# Patient Record
Sex: Female | Born: 1955 | Race: Black or African American | Hispanic: No | Marital: Married
Health system: Southern US, Community
[De-identification: ages and names within clinical notes are randomized; demographics above are authoritative.]

## PROBLEM LIST (undated history)

## (undated) DIAGNOSIS — J181 Lobar pneumonia, unspecified organism: Secondary | ICD-10-CM

## (undated) DIAGNOSIS — J80 Acute respiratory distress syndrome: Secondary | ICD-10-CM

## (undated) DIAGNOSIS — I5021 Acute systolic (congestive) heart failure: Secondary | ICD-10-CM

## (undated) DIAGNOSIS — R6521 Severe sepsis with septic shock: Secondary | ICD-10-CM

## (undated) DIAGNOSIS — J9621 Acute and chronic respiratory failure with hypoxia: Secondary | ICD-10-CM

## (undated) DIAGNOSIS — N170 Acute kidney failure with tubular necrosis: Secondary | ICD-10-CM

## (undated) DIAGNOSIS — A419 Sepsis, unspecified organism: Secondary | ICD-10-CM

## (undated) HISTORY — PX: TRACHEOSTOMY: SUR1362

## (undated) HISTORY — PX: PEG PLACEMENT: SHX5437

---

## 2018-01-24 ENCOUNTER — Other Ambulatory Visit (HOSPITAL_COMMUNITY): Payer: Self-pay

## 2018-01-24 ENCOUNTER — Inpatient Hospital Stay
Admission: RE | Admit: 2018-01-24 | Discharge: 2018-03-19 | Disposition: A | Discharge status: Skilled Nursing Facility | Payer: Medicare Other | Attending: Internal Medicine | Admitting: Internal Medicine

## 2018-01-24 DIAGNOSIS — R079 Chest pain, unspecified: Secondary | ICD-10-CM

## 2018-01-24 DIAGNOSIS — R6521 Severe sepsis with septic shock: Secondary | ICD-10-CM

## 2018-01-24 DIAGNOSIS — K8020 Calculus of gallbladder without cholecystitis without obstruction: Secondary | ICD-10-CM

## 2018-01-24 DIAGNOSIS — J9621 Acute and chronic respiratory failure with hypoxia: Secondary | ICD-10-CM

## 2018-01-24 DIAGNOSIS — R109 Unspecified abdominal pain: Secondary | ICD-10-CM

## 2018-01-24 DIAGNOSIS — J181 Lobar pneumonia, unspecified organism: Secondary | ICD-10-CM

## 2018-01-24 DIAGNOSIS — Z931 Gastrostomy status: Secondary | ICD-10-CM

## 2018-01-24 DIAGNOSIS — I5021 Acute systolic (congestive) heart failure: Secondary | ICD-10-CM

## 2018-01-24 DIAGNOSIS — R17 Unspecified jaundice: Secondary | ICD-10-CM

## 2018-01-24 DIAGNOSIS — R0902 Hypoxemia: Secondary | ICD-10-CM

## 2018-01-24 DIAGNOSIS — A419 Sepsis, unspecified organism: Secondary | ICD-10-CM

## 2018-01-24 DIAGNOSIS — J80 Acute respiratory distress syndrome: Secondary | ICD-10-CM

## 2018-01-24 DIAGNOSIS — J9690 Respiratory failure, unspecified, unspecified whether with hypoxia or hypercapnia: Secondary | ICD-10-CM

## 2018-01-24 DIAGNOSIS — J811 Chronic pulmonary edema: Secondary | ICD-10-CM

## 2018-01-24 DIAGNOSIS — I509 Heart failure, unspecified: Secondary | ICD-10-CM

## 2018-01-24 DIAGNOSIS — D72829 Elevated white blood cell count, unspecified: Secondary | ICD-10-CM

## 2018-01-24 DIAGNOSIS — N189 Chronic kidney disease, unspecified: Secondary | ICD-10-CM

## 2018-01-24 DIAGNOSIS — N170 Acute kidney failure with tubular necrosis: Secondary | ICD-10-CM

## 2018-01-24 HISTORY — DX: Acute respiratory distress syndrome: J80

## 2018-01-24 HISTORY — DX: Lobar pneumonia, unspecified organism: J18.1

## 2018-01-24 HISTORY — DX: Acute and chronic respiratory failure with hypoxia: J96.21

## 2018-01-24 HISTORY — DX: Acute systolic (congestive) heart failure: I50.21

## 2018-01-24 HISTORY — DX: Sepsis, unspecified organism: A41.9

## 2018-01-24 HISTORY — DX: Acute kidney failure with tubular necrosis: N17.0

## 2018-01-24 HISTORY — DX: Severe sepsis with septic shock: R65.21

## 2018-01-24 LAB — BLOOD GAS, ARTERIAL
ACID-BASE DEFICIT: 2.6 mmol/L — AB (ref 0.0–2.0)
BICARBONATE: 21.7 mmol/L (ref 20.0–28.0)
FIO2: 40
O2 Saturation: 94.8 %
PEEP/CPAP: 5 cmH2O
PRESSURE CONTROL: 18 cmH2O
Patient temperature: 98.6
RATE: 14 resp/min
pCO2 arterial: 37.9 mmHg (ref 32.0–48.0)
pH, Arterial: 7.376 (ref 7.350–7.450)
pO2, Arterial: 75.1 mmHg — ABNORMAL LOW (ref 83.0–108.0)

## 2018-01-24 MED ORDER — IOPAMIDOL (ISOVUE-300) INJECTION 61%
INTRAVENOUS | Status: AC
  Administered 2018-01-24: 50 mL via GASTROSTOMY
  Filled 2018-01-24: qty 50

## 2018-01-25 ENCOUNTER — Encounter: Payer: Self-pay | Admitting: Internal Medicine

## 2018-01-25 ENCOUNTER — Other Ambulatory Visit (HOSPITAL_COMMUNITY): Payer: Self-pay

## 2018-01-25 DIAGNOSIS — N170 Acute kidney failure with tubular necrosis: Secondary | ICD-10-CM | POA: Diagnosis not present

## 2018-01-25 DIAGNOSIS — J80 Acute respiratory distress syndrome: Secondary | ICD-10-CM

## 2018-01-25 DIAGNOSIS — I5021 Acute systolic (congestive) heart failure: Secondary | ICD-10-CM

## 2018-01-25 DIAGNOSIS — J9621 Acute and chronic respiratory failure with hypoxia: Secondary | ICD-10-CM | POA: Diagnosis not present

## 2018-01-25 DIAGNOSIS — R6521 Severe sepsis with septic shock: Secondary | ICD-10-CM | POA: Diagnosis not present

## 2018-01-25 DIAGNOSIS — J181 Lobar pneumonia, unspecified organism: Secondary | ICD-10-CM

## 2018-01-25 DIAGNOSIS — A419 Sepsis, unspecified organism: Secondary | ICD-10-CM | POA: Diagnosis not present

## 2018-01-25 DIAGNOSIS — R042 Hemoptysis: Secondary | ICD-10-CM | POA: Diagnosis not present

## 2018-01-25 HISTORY — DX: Lobar pneumonia, unspecified organism: J18.1

## 2018-01-25 HISTORY — DX: Severe sepsis with septic shock: A41.9

## 2018-01-25 HISTORY — DX: Acute respiratory distress syndrome: J80

## 2018-01-25 HISTORY — DX: Acute kidney failure with tubular necrosis: N17.0

## 2018-01-25 HISTORY — DX: Acute systolic (congestive) heart failure: I50.21

## 2018-01-25 HISTORY — DX: Acute and chronic respiratory failure with hypoxia: J96.21

## 2018-01-25 LAB — COMPREHENSIVE METABOLIC PANEL
ALT: 123 U/L — ABNORMAL HIGH (ref 14–54)
ANION GAP: 19 — AB (ref 5–15)
AST: 108 U/L — AB (ref 15–41)
Albumin: 2.6 g/dL — ABNORMAL LOW (ref 3.5–5.0)
Alkaline Phosphatase: 491 U/L — ABNORMAL HIGH (ref 38–126)
BUN: 121 mg/dL — ABNORMAL HIGH (ref 6–20)
CHLORIDE: 95 mmol/L — AB (ref 101–111)
CO2: 19 mmol/L — ABNORMAL LOW (ref 22–32)
Calcium: 9.1 mg/dL (ref 8.9–10.3)
Creatinine, Ser: 5.29 mg/dL — ABNORMAL HIGH (ref 0.44–1.00)
GFR, EST AFRICAN AMERICAN: 9 mL/min — AB (ref >60)
GFR, EST NON AFRICAN AMERICAN: 8 mL/min — AB (ref >60)
Glucose, Bld: 135 mg/dL — ABNORMAL HIGH (ref 65–99)
POTASSIUM: 5.1 mmol/L (ref 3.5–5.1)
Sodium: 133 mmol/L — ABNORMAL LOW (ref 135–145)
Total Bilirubin: 4.3 mg/dL — ABNORMAL HIGH (ref 0.3–1.2)
Total Protein: 7.3 g/dL (ref 6.5–8.1)

## 2018-01-25 LAB — APTT: APTT: 25 s (ref 24–36)

## 2018-01-25 LAB — CBC
HCT: 26.1 % — ABNORMAL LOW (ref 36.0–46.0)
Hemoglobin: 8.1 g/dL — ABNORMAL LOW (ref 12.0–15.0)
MCH: 30.6 pg (ref 26.0–34.0)
MCHC: 31 g/dL (ref 30.0–36.0)
MCV: 98.5 fL (ref 78.0–100.0)
PLATELETS: 309 10*3/uL (ref 150–400)
RBC: 2.65 MIL/uL — ABNORMAL LOW (ref 3.87–5.11)
RDW: 21.7 % — AB (ref 11.5–15.5)
WBC: 26.5 10*3/uL — AB (ref 4.0–10.5)

## 2018-01-25 LAB — PROTIME-INR
INR: 1.15
PROTHROMBIN TIME: 14.6 s (ref 11.4–15.2)

## 2018-01-25 LAB — MAGNESIUM: MAGNESIUM: 2.5 mg/dL — AB (ref 1.7–2.4)

## 2018-01-25 MED ORDER — GENERIC EXTERNAL MEDICATION
5.00 | Status: DC
Start: ? — End: 2018-01-25

## 2018-01-25 MED ORDER — FAMOTIDINE 20 MG/2ML IV SOLN
20.00 | INTRAVENOUS | Status: DC
Start: 2018-01-25 — End: 2018-01-25

## 2018-01-25 MED ORDER — INSULIN GLARGINE 100 UNIT/ML ~~LOC~~ SOLN
1.00 | SUBCUTANEOUS | Status: DC
Start: 2018-01-24 — End: 2018-01-25

## 2018-01-25 MED ORDER — NITROGLYCERIN 0.4 MG SL SUBL
0.40 | SUBLINGUAL_TABLET | SUBLINGUAL | Status: DC
Start: ? — End: 2018-01-25

## 2018-01-25 MED ORDER — INSULIN GLARGINE 100 UNIT/ML ~~LOC~~ SOLN
1.00 | SUBCUTANEOUS | Status: DC
Start: ? — End: 2018-01-25

## 2018-01-25 MED ORDER — THIAMINE HCL 100 MG PO TABS
500.00 | ORAL_TABLET | ORAL | Status: DC
Start: 2018-01-24 — End: 2018-01-25

## 2018-01-25 MED ORDER — ALBUMIN HUMAN 25 % IV SOLN
12.50 | INTRAVENOUS | Status: DC
Start: ? — End: 2018-01-25

## 2018-01-25 MED ORDER — ONDANSETRON HCL 4 MG/2ML IJ SOLN
4.00 | INTRAMUSCULAR | Status: DC
Start: ? — End: 2018-01-25

## 2018-01-25 MED ORDER — POLYETHYLENE GLYCOL 3350 17 G PO PACK
17.00 | PACK | ORAL | Status: DC
Start: ? — End: 2018-01-25

## 2018-01-25 MED ORDER — ALBUTEROL SULFATE (2.5 MG/3ML) 0.083% IN NEBU
2.50 | INHALATION_SOLUTION | RESPIRATORY_TRACT | Status: DC
Start: ? — End: 2018-01-25

## 2018-01-25 MED ORDER — HYDROCORTISONE NA SUCCINATE PF 100 MG IJ SOLR
50.00 | INTRAMUSCULAR | Status: DC
Start: 2018-01-25 — End: 2018-01-25

## 2018-01-25 MED ORDER — SENNOSIDES 8.8 MG/5ML PO SYRP
15.00 | ORAL_SOLUTION | ORAL | Status: DC
Start: ? — End: 2018-01-25

## 2018-01-25 MED ORDER — SENNA 8.6 MG PO TABS
1.00 | ORAL_TABLET | ORAL | Status: DC
Start: ? — End: 2018-01-25

## 2018-01-25 MED ORDER — SODIUM CHLORIDE 0.9 % IJ SOLN
50.00 | INTRAMUSCULAR | Status: DC
Start: ? — End: 2018-01-25

## 2018-01-25 MED ORDER — SODIUM CHLORIDE 0.9 % IV SOLN
150.00 | INTRAVENOUS | Status: DC
Start: ? — End: 2018-01-25

## 2018-01-25 MED ORDER — INSULIN LISPRO 100 UNIT/ML ~~LOC~~ SOLN
1.00 | SUBCUTANEOUS | Status: DC
Start: ? — End: 2018-01-25

## 2018-01-25 MED ORDER — BISACODYL 10 MG RE SUPP
10.00 | RECTAL | Status: DC
Start: ? — End: 2018-01-25

## 2018-01-25 MED ORDER — SODIUM CHLORIDE 0.9 % IV SOLN
10.00 | INTRAVENOUS | Status: DC
Start: ? — End: 2018-01-25

## 2018-01-25 MED ORDER — CHLORHEXIDINE GLUCONATE 0.12 % MT SOLN
15.00 | OROMUCOSAL | Status: DC
Start: 2018-01-24 — End: 2018-01-25

## 2018-01-25 MED ORDER — FENTANYL CITRATE (PF) 100 MCG/2ML IJ SOLN
25.00 | INTRAMUSCULAR | Status: DC
Start: ? — End: 2018-01-25

## 2018-01-25 MED ORDER — HEPARIN SODIUM (PORCINE) 5000 UNIT/ML IJ SOLN
5000.00 | INTRAMUSCULAR | Status: DC
Start: 2018-01-25 — End: 2018-01-25

## 2018-01-25 MED ORDER — HEPARIN SODIUM (PORCINE) 1000 UNIT/ML IJ SOLN
500.00 | INTRAMUSCULAR | Status: DC
Start: ? — End: 2018-01-25

## 2018-01-25 MED ORDER — HYDRALAZINE HCL 20 MG/ML IJ SOLN
10.00 | INTRAMUSCULAR | Status: DC
Start: ? — End: 2018-01-25

## 2018-01-25 MED ORDER — ALTEPLASE 2 MG IJ SOLR
2.00 | INTRAMUSCULAR | Status: DC
Start: ? — End: 2018-01-25

## 2018-01-25 MED ORDER — CLOTRIMAZOLE 1 % EX CREA
TOPICAL_CREAM | CUTANEOUS | Status: DC
Start: 2018-01-24 — End: 2018-01-25

## 2018-01-25 MED ORDER — GENERIC EXTERNAL MEDICATION
1.00 | Status: DC
Start: 2018-01-25 — End: 2018-01-25

## 2018-01-25 MED ORDER — DIPHENHYDRAMINE HCL 50 MG/ML IJ SOLN
12.50 | INTRAMUSCULAR | Status: DC
Start: ? — End: 2018-01-25

## 2018-01-25 MED ORDER — INSULIN LISPRO 100 UNIT/ML ~~LOC~~ SOLN
1.00 | SUBCUTANEOUS | Status: DC
Start: 2018-01-24 — End: 2018-01-25

## 2018-01-25 NOTE — Consult Note (Signed)
Pulmonary Critical Care Medicine [facility] PULMONARY SERVICE  Date of Service: 01/25/2018  PULMONARY CONSULT   Terri Ellis  ZOX:[id number]  DOB: 1956/04/15   DOA: 01/24/2018  Referring Physician: Carron Curie, MD  HPI: Terri Ellis is a 63 y.o. female seen for follow up of Acute on Chronic Respiratory Failure.  Patient was admitted for further management of respiratory failure currently is on pressure support mode.  Presented to the hospital with a diagnosis of congestive heart failure elevated troponin stage III kidney disease and bacterial pneumonia.  From the review of the chart patient had been having some complaints of urinary symptoms.  The patient had increasing shortness of breath when she was going to the bathroom.  Apparently took some Benadryl however it did not help.  When she presented to the ED she had a leukocytosis CT scan showed bilateral pulmonary infiltrates she was started on BiPAP to help maintain her chest.  Pulmonology was asked to see the patient and felt that she had small effusions along with pneumonia.  Also she was noted to have elevated troponins felt to be stress-induced.  Patient also developed acute kidney injury from ATN and nephrology did see the patient for this purpose.  Cardiology evaluated the patient felt that she had stress-induced elevation of troponins.  She did also undergo a PEG placement.  During her hospital course she developed severe ARDS along with shock  Review of Systems:  ROS performed and is unremarkable other than noted above.  Past Medical History:  Diagnosis Date  . Acute on chronic respiratory failure with hypoxia (HCC) 01/25/2018  . Acute systolic CHF (congestive heart failure) (HCC) 01/25/2018  . Acute tubular necrosis (HCC) 01/25/2018  . ARDS (adult respiratory distress syndrome) (HCC) 01/25/2018  . Lobar pneumonia (HCC) 01/25/2018  . Severe sepsis with septic shock (HCC) 01/25/2018    Past  Surgical History:  Procedure Laterality Date  . PEG PLACEMENT    . TRACHEOSTOMY      Social History:    has no tobacco, alcohol, and drug history on file.  Family History: Non-Contributory to the present illness  Allergies  Allergen Reactions  . Diclofenac Other (See Comments)    Intolerance, elevated LFT Elevated LFT   . Pravastatin Other (See Comments)    Chest pain Chest pain   . Amitriptyline Other (See Comments) and Rash    Unknown   . Atorvastatin Other (See Comments)    Pain Hurt all over   . Desipramine Other (See Comments)    "felt funny" Felt funny   . Indomethacin Other (See Comments) and Nausea And Vomiting    GI upset   . Oxycodone Other (See Comments)    Caused hallucinations & falling  . Penicillins Hives and Rash    Pt took Keflex in 1996 with no problem   . Phentermine-Topiramate Other (See Comments)    Yeast infection It gave me a really bad yeast infection   . Prednisone Other (See Comments)    Dysphoria dysphoria   . Clarithromycin Other (See Comments)    Hives  . Diphenhydramine Hives    Medications: Reviewed on Rounds  Physical Exam:  Vitals: Temperature 97.6 pulse 92 respiratory rate 26 blood pressure 138/83 saturations 95%  Ventilator Settings mode of ventilation pressure support FiO2 40% tidal volume 416 pressure support 10 PEEP 5  . General: Comfortable at this time . Eyes: Grossly normal lids, irises & conjunctiva . ENT: grossly tongue is normal . Neck: no obvious mass .  Cardiovascular: S1-S2 normal no gallop or rub . Respiratory: No rhonchi expansion is equal . Abdomen: Soft nontender . Skin: no rash seen on limited exam . Musculoskeletal: not rigid . Psychiatric:unable to assess . Neurologic: no seizure no involuntary movements         Labs on Admission:  Basic Metabolic Panel: Recent Labs  Lab 01/25/18 0848  NA 133*  K 5.1  CL 95*  CO2 19*  GLUCOSE 135*  BUN 121*  CREATININE 5.29*  CALCIUM 9.1  MG  2.5*    Liver Function Tests: Recent Labs  Lab 01/25/18 0848  AST 108*  ALT 123*  ALKPHOS 491*  BILITOT 4.3*  PROT 7.3  ALBUMIN 2.6*   No results for input(s): LIPASE, AMYLASE in the last 168 hours. No results for input(s): AMMONIA in the last 168 hours.  CBC: Recent Labs  Lab 01/25/18 0848  WBC 26.5*  HGB 8.1*  HCT 26.1*  MCV 98.5  PLT 309    Cardiac Enzymes: No results for input(s): CKTOTAL, CKMB, CKMBINDEX, TROPONINI in the last 168 hours.  BNP (last 3 results) No results for input(s): BNP in the last 8760 hours.  ProBNP (last 3 results) No results for input(s): PROBNP in the last 8760 hours.   Radiological Exams on Admission: Dg Chest Port 1 View  Result Date: 01/25/2018 CLINICAL DATA:  Shortness of breath EXAM: PORTABLE CHEST 1 VIEW COMPARISON:  None. FINDINGS: There is cardiomegaly present with mild pulmonary edema. Small effusions cannot be excluded. Large bore right central venous line tip overlies the lower SVC and tracheostomy is present. IMPRESSION: 1. Cardiomegaly with mild edema. Cannot exclude small pleural effusions. 2. Tracheostomy and right central venous line are present. Electronically Signed   By: Dwyane Dee M.D.   On: 01/25/2018 09:44   Dg Abd Portable 1v  Result Date: 01/24/2018 CLINICAL DATA:  Status post PEG tube placement EXAM: PORTABLE ABDOMEN - 1 VIEW COMPARISON:  None. FINDINGS: KUB obtained after injection of approximately 50 cc of Isovue-300. Contrast is pooling in the fundus. No gross extravasation. IMPRESSION: Contrast within the gastric fundus.  No gross extravasation. Electronically Signed   By: Jasmine Pang M.D.   On: 01/24/2018 23:00    Assessment/Plan Principal Problem:   Acute on chronic respiratory failure with hypoxia (HCC) Active Problems:   Acute tubular necrosis (HCC)   Lobar pneumonia (HCC)   Severe sepsis with septic shock (HCC)   ARDS (adult respiratory distress syndrome) (HCC)   Acute systolic CHF (congestive  heart failure) (HCC)   1. Acute on chronic respiratory failure with hypoxia patient is on pressure support wean at this time currently is on 40% oxygen.  Her goal will be to advance the wean as tolerated we will continue with secretion management pulmonary toilet 2. Acute tubular necrosis patient's labs will be monitored had to have continuous renal replacement therapy at the transferring facility previously.  We will get neurology to see if necessary. 3. Lobar pneumonia treated with antibiotics we will continue supportive care 4. Severe sepsis with shock hemodynamically stable we will continue to follow 5. ARDS follow-up x-rays as necessary her current oxygen requirements have improved 6. Acute systolic heart failure patient needs to be kept on the dry side we will continue to monitor closely  I have personally seen and evaluated the patient, evaluated laboratory and imaging results, formulated the assessment and plan and placed orders. The Patient requires high complexity decision making for assessment and support.  Case was discussed on Rounds with  the Respiratory Therapy Staff Time Spent  Yevonne Pax, MD [facility] Pulmonary Critical Care Medicine Sleep Medicine

## 2018-01-26 DIAGNOSIS — I5021 Acute systolic (congestive) heart failure: Secondary | ICD-10-CM | POA: Diagnosis not present

## 2018-01-26 DIAGNOSIS — N170 Acute kidney failure with tubular necrosis: Secondary | ICD-10-CM | POA: Diagnosis not present

## 2018-01-26 DIAGNOSIS — J181 Lobar pneumonia, unspecified organism: Secondary | ICD-10-CM

## 2018-01-26 DIAGNOSIS — J80 Acute respiratory distress syndrome: Secondary | ICD-10-CM

## 2018-01-26 DIAGNOSIS — J9621 Acute and chronic respiratory failure with hypoxia: Secondary | ICD-10-CM

## 2018-01-26 DIAGNOSIS — A419 Sepsis, unspecified organism: Secondary | ICD-10-CM

## 2018-01-26 DIAGNOSIS — R6521 Severe sepsis with septic shock: Secondary | ICD-10-CM

## 2018-01-26 NOTE — Progress Notes (Signed)
Pulmonary Critical Care Medicine [facility] [place]   PULMONARY SERVICE  PROGRESS NOTE  Date of Service: 01/26/2018  Terri Ellis  MWN:[id number]  DOB: Sep 16, 1956   DOA: 01/24/2018  Referring Physician: Carron Curie, MD  HPI: Terri Ellis is a 62 y.o. female seen for follow up of Acute on Chronic Respiratory Failure.  Patient is weaning doing fairly well.  Family was present in the room and they were updated.  Right now is afebrile  Medications: Reviewed on Rounds  Physical Exam:  Vitals: Temperature 97.6 pulse 96 respiratory rate 35 blood pressure 139/73 saturation 97%  Ventilator Settings mode of ventilation pressure support 28% FiO2.  Pressure support level is 12/5 tidal lines are about 538  . General: Comfortable at this time . Eyes: Grossly normal lids, irises & conjunctiva . ENT: grossly tongue is normal . Neck: no obvious mass . Cardiovascular: S1-S2 normal no gallop or rub . Respiratory: Scattered rhonchi expansion is equal . Abdomen: Soft nontender . Skin: no rash seen on limited exam . Musculoskeletal: not rigid . Psychiatric:unable to assess . Neurologic: no seizure no involuntary movements         Labs on Admission:  Basic Metabolic Panel: Recent Labs  Lab 01/25/18 0848  NA 133*  K 5.1  CL 95*  CO2 19*  GLUCOSE 135*  BUN 121*  CREATININE 5.29*  CALCIUM 9.1  MG 2.5*    Liver Function Tests: Recent Labs  Lab 01/25/18 0848  AST 108*  ALT 123*  ALKPHOS 491*  BILITOT 4.3*  PROT 7.3  ALBUMIN 2.6*   No results for input(s): LIPASE, AMYLASE in the last 168 hours. No results for input(s): AMMONIA in the last 168 hours.  CBC: Recent Labs  Lab 01/25/18 0848  WBC 26.5*  HGB 8.1*  HCT 26.1*  MCV 98.5  PLT 309    Cardiac Enzymes: No results for input(s): CKTOTAL, CKMB, CKMBINDEX, TROPONINI in the last 168 hours.  BNP (last 3 results) No results for input(s): BNP in the last 8760 hours.  ProBNP (last 3  results) No results for input(s): PROBNP in the last 8760 hours.  Radiological Exams on Admission: Dg Chest Port 1 View  Result Date: 01/25/2018 CLINICAL DATA:  Shortness of breath EXAM: PORTABLE CHEST 1 VIEW COMPARISON:  None. FINDINGS: There is cardiomegaly present with mild pulmonary edema. Small effusions cannot be excluded. Large bore right central venous line tip overlies the lower SVC and tracheostomy is present. IMPRESSION: 1. Cardiomegaly with mild edema. Cannot exclude small pleural effusions. 2. Tracheostomy and right central venous line are present. Electronically Signed   By: Dwyane Dee M.D.   On: 01/25/2018 09:44   Dg Abd Portable 1v  Result Date: 01/24/2018 CLINICAL DATA:  Status post PEG tube placement EXAM: PORTABLE ABDOMEN - 1 VIEW COMPARISON:  None. FINDINGS: KUB obtained after injection of approximately 50 cc of Isovue-300. Contrast is pooling in the fundus. No gross extravasation. IMPRESSION: Contrast within the gastric fundus.  No gross extravasation. Electronically Signed   By: Jasmine Pang M.D.   On: 01/24/2018 23:00    Assessment/Plan Principal Problem:   Acute on chronic respiratory failure with hypoxia (HCC) Active Problems:   Acute tubular necrosis (HCC)   Lobar pneumonia (HCC)   Severe sepsis with septic shock (HCC)   ARDS (adult respiratory distress syndrome) (HCC)   Acute systolic CHF (congestive heart failure) (HCC)   1. Acute on chronic respiratory failure with hypoxia we will continue weaning patient is tolerating pressure  support mode well so far we will continue to advance and pressure support as tolerated. 2. Severe sepsis with septic shock hemodynamically doing better we will continue to monitor 3. Acute tubular necrosis patient will have nephrology consultation evaluate 4. Lobar pneumonia treated with antibiotics 5. ARDS seems to be showing some clinical improvement need to continue to follow. 6. Acute systolic heart failure will get nephrology  recommendations.   I have personally seen and evaluated the patient, evaluated laboratory and imaging results, formulated the assessment and plan and placed orders. The Patient requires high complexity decision making for assessment and support.  Case was discussed on Rounds with the Respiratory Therapy Staff time spent 35 minutes coordination of care and family updates  Yevonne Pax, MD [facility] Pulmonary Critical Care Medicine Sleep Medicine

## 2018-01-26 NOTE — Consult Note (Signed)
[facility] CONSULT NOTE    Date: 01/26/2018                  Patient Name:  Terri Ellis  MRN: [id number]  DOB: [birth year]  Age / Sex: 62 y.o., female         PCP: Shayne Alken, MD                 Service Requesting Consult: Hospitalist                 Reason for Consult: Acute renal failure            History of Present Illness: Patient is a 62 y.o. female with a PMHx of congestive heart failure, chronic kidney disease stage III, acute respiratory failure status post tracheostomy placement, hypertension, iron deficiency anemia, diabetes mellitus type 2, hyperlipidemia, obesity, who was admitted to [facility] on 01/24/2018 for ongoing treatment of pneumonia, acute respiratory failure, acute renal failure requiring hemodialysis.  She was recently admitted to the outside hospital from December 22, 2017 to January 24, 2018.  She presented with significant respiratory distress and was found to have ARDS.  Noninvasive ventilation was initially attempted however patient was transitioned to the ventilator and subsequent we had a tracheostomy placement.  Later the course of the hospitalization patient developed acute renal failure.  She was initially started on CRRT and later during the hospitalization was transitioned over to intermittent hemodialysis.  She has a right internal jugular PermCath in place which appears to be functional.  Her last dialysis treatment was apparently on Saturday.  She continues to have ongoing azotemia.  BUN currently 121 with a creatinine of 5.29.  Potassium currently 5.1.  She also has associated anemia with a hemoglobin of 8.1.  Her baseline creatinine appears to be 1.8.   Medications:  Current medications: Cefepime 1 g twice daily, Humalog sliding scale, Pepcid 20 mg twice daily, heparin 5000 units subcutaneous every 8 hours, pro-stat 30 cc p.o. twice daily, Solu-Cortef 50 mg IV every 12 hours, multivitamin 1 tablet  daily   Allergies: Allergies  Allergen Reactions  . Diclofenac Other (See Comments)    Intolerance, elevated LFT Elevated LFT   . Pravastatin Other (See Comments)    Chest pain Chest pain   . Amitriptyline Other (See Comments) and Rash    Unknown   . Atorvastatin Other (See Comments)    Pain Hurt all over   . Desipramine Other (See Comments)    "felt funny" Felt funny   . Indomethacin Other (See Comments) and Nausea And Vomiting    GI upset   . Oxycodone Other (See Comments)    Caused hallucinations & falling  . Penicillins Hives and Rash    Pt took Keflex in 1996 with no problem   . Phentermine-Topiramate Other (See Comments)    Yeast infection It gave me a really bad yeast infection   . Prednisone Other (See Comments)    Dysphoria dysphoria   . Clarithromycin Other (See Comments)    Hives  . Diphenhydramine Hives      Past Medical History: Past Medical History:  Diagnosis Date  . Acute on chronic respiratory failure with hypoxia (HCC) 01/25/2018  . Acute systolic CHF (congestive heart failure) (HCC) 01/25/2018  . Acute tubular necrosis (HCC) 01/25/2018  . ARDS (adult respiratory distress syndrome) (HCC) 01/25/2018  . Lobar pneumonia (HCC) 01/25/2018  . Severe sepsis with septic shock (HCC) 01/25/2018  Past Surgical History: Past Surgical History:  Procedure Laterality Date  . PEG PLACEMENT    . TRACHEOSTOMY       Family History: Family History  Family history unknown: Yes     Social History: Social History   Socioeconomic History  . Marital status: Married    Spouse name: Not on file  . Number of children: Not on file  . Years of education: Not on file  . Highest education level: Not on file  Occupational History  . Not on file  Social Needs  . Financial resource strain: Not on file  . Food insecurity:    Worry: Not on file    Inability: Not on file  . Transportation needs:    Medical: Not on file    Non-medical: Not on file   Tobacco Use  . Smoking status: Unknown If Ever Smoked  . Smokeless tobacco: Never Used  Substance and Sexual Activity  . Alcohol use: Not Currently  . Drug use: Not Currently  . Sexual activity: Not Currently  Lifestyle  . Physical activity:    Days per week: Not on file    Minutes per session: Not on file  . Stress: Not on file  Relationships  . Social connections:    Talks on phone: Not on file    Gets together: Not on file    Attends religious service: Not on file    Active member of club or organization: Not on file    Attends meetings of clubs or organizations: Not on file    Relationship status: Not on file  . Intimate partner violence:    Fear of current or ex partner: Not on file    Emotionally abused: Not on file    Physically abused: Not on file    Forced sexual activity: Not on file  Other Topics Concern  . Not on file  Social History Narrative  . Not on file     Review of Systems: Cannot provide as she is on the ventilator.  Vital Signs: Temperature 97.6 pulse 96 respirations 30 blood pressure 139/73  Weight trends: There were no vitals filed for this visit.  Physical Exam: General: Critically ill appearing  Head: Normocephalic, atraumatic.  Eyes: Anicteric, spontaneous EOMs noted  Nose: Mucous membranes moist, not inflammed, nonerythematous.  Throat: Oral mucosa moist, pharynx not visualized  Neck: Tracheostomy in place  Lungs:  Scattered rhonchi, vent assisted  Heart: S1S2 no obvious rub  Abdomen:  BS normoactive. Soft, Nondistended, non-tender.  PEG in place  Extremities: 1+ pretibial edema.  Neurologic: Awake, will follow simple commands  Skin: No visible rashes, scars.    Lab results: Basic Metabolic Panel: Recent Labs  Lab 01/25/18 0848  NA 133*  K 5.1  CL 95*  CO2 19*  GLUCOSE 135*  BUN 121*  CREATININE 5.29*  CALCIUM 9.1  MG 2.5*    Liver Function Tests: Recent Labs  Lab 01/25/18 0848  AST 108*  ALT 123*  ALKPHOS 491*   BILITOT 4.3*  PROT 7.3  ALBUMIN 2.6*   No results for input(s): LIPASE, AMYLASE in the last 168 hours. No results for input(s): AMMONIA in the last 168 hours.  CBC: Recent Labs  Lab 01/25/18 0848  WBC 26.5*  HGB 8.1*  HCT 26.1*  MCV 98.5  PLT 309    Cardiac Enzymes: No results for input(s): CKTOTAL, CKMB, CKMBINDEX, TROPONINI in the last 168 hours.  BNP: Invalid input(s): POCBNP  CBG: No results for input(s): GLUCAP  in the last 168 hours.  Microbiology: No results found for this or any previous visit.  Coagulation Studies: Recent Labs    01/25/18 0848  LABPROT 14.6  INR 1.15    Urinalysis: No results for input(s): COLORURINE, LABSPEC, PHURINE, GLUCOSEU, HGBUR, BILIRUBINUR, KETONESUR, PROTEINUR, UROBILINOGEN, NITRITE, LEUKOCYTESUR in the last 72 hours.  Invalid input(s): APPERANCEUR    Imaging: Dg Chest Port 1 View  Result Date: 01/25/2018 CLINICAL DATA:  Shortness of breath EXAM: PORTABLE CHEST 1 VIEW COMPARISON:  None. FINDINGS: There is cardiomegaly present with mild pulmonary edema. Small effusions cannot be excluded. Large bore right central venous line tip overlies the lower SVC and tracheostomy is present. IMPRESSION: 1. Cardiomegaly with mild edema. Cannot exclude small pleural effusions. 2. Tracheostomy and right central venous line are present. Electronically Signed   By: Dwyane Dee M.D.   On: 01/25/2018 09:44   Dg Abd Portable 1v  Result Date: 01/24/2018 CLINICAL DATA:  Status post PEG tube placement EXAM: PORTABLE ABDOMEN - 1 VIEW COMPARISON:  None. FINDINGS: KUB obtained after injection of approximately 50 cc of Isovue-300. Contrast is pooling in the fundus. No gross extravasation. IMPRESSION: Contrast within the gastric fundus.  No gross extravasation. Electronically Signed   By: Jasmine Pang M.D.   On: 01/24/2018 23:00      Assessment & Plan: Pt is a 62 y.o. female with a PMHx of congestive heart failure, chronic kidney disease stage III,  acute respiratory failure status post tracheostomy placement, hypertension, iron deficiency anemia, diabetes mellitus type 2, hyperlipidemia, obesity, who was admitted to [facility] on 01/24/2018 for ongoing treatment of pneumonia, acute respiratory failure, acute renal failure requiring hemodialysis.  1.  Acute renal fluid/chronic kidney disease stage III baseline creatinine 1.8.  Patient was seen by nephrology at the outside hospital.  She was initially started on CRRT and subsequently transitioned to intermittent hemodialysis.  Her last dialysis treatment was on Saturday.  We will reinitiate dialysis treatment tomorrow.  Dialysis time will be 3.5 hours, blood flow rate 400, dialysate flow rate 800, ultrafiltration target 2 kg.  Thereafter we will maintain her on a Tuesday, Thursday, Saturday schedule.  2.  Acute respiratory failure.  She remains on the ventilator at this point in time.  Pulmonary/critical care has been consulted and hopefully they can wean the patient off of the ventilator.  3.  Anemia of chronic kidney disease.  Hemoglobin currently 8.1.  We will consider starting the patient on epoetin during this hospitalization.  4.  Secondary hyperparathyroidism.  We will check PTH and phosphorus during hospitalization.  Most recent serum calcium was 9.1.  Further plan was these labs are available.  5.  Thanks for consultation.

## 2018-01-27 DIAGNOSIS — I5021 Acute systolic (congestive) heart failure: Secondary | ICD-10-CM | POA: Diagnosis not present

## 2018-01-27 DIAGNOSIS — J9621 Acute and chronic respiratory failure with hypoxia: Secondary | ICD-10-CM | POA: Diagnosis not present

## 2018-01-27 DIAGNOSIS — N170 Acute kidney failure with tubular necrosis: Secondary | ICD-10-CM | POA: Diagnosis not present

## 2018-01-27 DIAGNOSIS — J80 Acute respiratory distress syndrome: Secondary | ICD-10-CM | POA: Diagnosis not present

## 2018-01-27 LAB — CBC
HCT: 18.1 % — ABNORMAL LOW (ref 36.0–46.0)
Hemoglobin: 5.8 g/dL — CL (ref 12.0–15.0)
MCH: 30.9 pg (ref 26.0–34.0)
MCHC: 32 g/dL (ref 30.0–36.0)
MCV: 96.3 fL (ref 78.0–100.0)
PLATELETS: 237 10*3/uL (ref 150–400)
RBC: 1.88 MIL/uL — ABNORMAL LOW (ref 3.87–5.11)
RDW: 21.7 % — AB (ref 11.5–15.5)
WBC: 23.8 10*3/uL — AB (ref 4.0–10.5)

## 2018-01-27 LAB — BASIC METABOLIC PANEL
Anion gap: 24 — ABNORMAL HIGH (ref 5–15)
BUN: 219 mg/dL — ABNORMAL HIGH (ref 6–20)
CO2: 15 mmol/L — ABNORMAL LOW (ref 22–32)
Calcium: 8.7 mg/dL — ABNORMAL LOW (ref 8.9–10.3)
Chloride: 99 mmol/L — ABNORMAL LOW (ref 101–111)
Creatinine, Ser: 6.89 mg/dL — ABNORMAL HIGH (ref 0.44–1.00)
GFR, EST AFRICAN AMERICAN: 7 mL/min — AB (ref >60)
GFR, EST NON AFRICAN AMERICAN: 6 mL/min — AB (ref >60)
Glucose, Bld: 189 mg/dL — ABNORMAL HIGH (ref 65–99)
POTASSIUM: 6 mmol/L — AB (ref 3.5–5.1)
SODIUM: 138 mmol/L (ref 135–145)

## 2018-01-27 LAB — RENAL FUNCTION PANEL
ALBUMIN: 2.3 g/dL — AB (ref 3.5–5.0)
ANION GAP: 23 — AB (ref 5–15)
BUN: 220 mg/dL — AB (ref 6–20)
CO2: 16 mmol/L — ABNORMAL LOW (ref 22–32)
Calcium: 8.7 mg/dL — ABNORMAL LOW (ref 8.9–10.3)
Chloride: 98 mmol/L — ABNORMAL LOW (ref 101–111)
Creatinine, Ser: 6.87 mg/dL — ABNORMAL HIGH (ref 0.44–1.00)
GFR, EST AFRICAN AMERICAN: 7 mL/min — AB (ref >60)
GFR, EST NON AFRICAN AMERICAN: 6 mL/min — AB (ref >60)
Glucose, Bld: 188 mg/dL — ABNORMAL HIGH (ref 65–99)
PHOSPHORUS: 12.6 mg/dL — AB (ref 2.5–4.6)
POTASSIUM: 6 mmol/L — AB (ref 3.5–5.1)
Sodium: 137 mmol/L (ref 135–145)

## 2018-01-27 LAB — PROTIME-INR
INR: 1.32
PROTHROMBIN TIME: 16.3 s — AB (ref 11.4–15.2)

## 2018-01-27 LAB — ABO/RH: ABO/RH(D): O POS

## 2018-01-27 LAB — LACTIC ACID, PLASMA: Lactic Acid, Venous: 0.9 mmol/L (ref 0.5–1.9)

## 2018-01-27 LAB — PREPARE RBC (CROSSMATCH)

## 2018-01-27 NOTE — Progress Notes (Signed)
Pulmonary Critical Care Medicine [facility] [place]   PULMONARY SERVICE  PROGRESS NOTE  Date of Service: 01/27/2018  Terri Ellis  ZOX:[id number]  DOB: October 23, 1955   DOA: 01/24/2018  Referring Physician: Carron Curie, MD  HPI: Terri Ellis is a 62 y.o. female seen for follow up of Acute on Chronic Respiratory Failure.  Currently patient is on full vent support.  Was doing T collar yesterday did very well.  Today was placed on full vent support for dialysis apparently.  Should be able to go back to weaning on T collar currently is on 40% oxygen.  Medications: Reviewed on Rounds  Physical Exam:  Vitals: Temperature 97.8 pulse 97 respiratory rate 32 blood pressure 157/75 saturations 99%  Ventilator Settings mode of ventilation assist control FiO2 40% tidal volume 575 PEEP 5  . General: Comfortable at this time . Eyes: Grossly normal lids, irises & conjunctiva . ENT: grossly tongue is normal . Neck: no obvious mass . Cardiovascular: S1-S2 normal no gallop or rub . Respiratory: No rhonchi expansion is equal . Abdomen: Soft nondistended . Skin: no rash seen on limited exam . Musculoskeletal: not rigid . Psychiatric:unable to assess . Neurologic: no seizure no involuntary movements         Labs on Admission:  Basic Metabolic Panel: Recent Labs  Lab 01/25/18 0848 01/27/18 0750  NA 133* 137  138  K 5.1 6.0*  6.0*  CL 95* 98*  99*  CO2 19* 16*  15*  GLUCOSE 135* 188*  189*  BUN 121* 220*  219*  CREATININE 5.29* 6.87*  6.89*  CALCIUM 9.1 8.7*  8.7*  MG 2.5*  --   PHOS  --  12.6*    Liver Function Tests: Recent Labs  Lab 01/25/18 0848 01/27/18 0750  AST 108*  --   ALT 123*  --   ALKPHOS 491*  --   BILITOT 4.3*  --   PROT 7.3  --   ALBUMIN 2.6* 2.3*   No results for input(s): LIPASE, AMYLASE in the last 168 hours. No results for input(s): AMMONIA in the last 168 hours.  CBC: Recent Labs  Lab 01/25/18 0848  01/27/18 0750  WBC 26.5* 23.8*  HGB 8.1* 5.8*  HCT 26.1* 18.1*  MCV 98.5 96.3  PLT 309 237    Cardiac Enzymes: No results for input(s): CKTOTAL, CKMB, CKMBINDEX, TROPONINI in the last 168 hours.  BNP (last 3 results) No results for input(s): BNP in the last 8760 hours.  ProBNP (last 3 results) No results for input(s): PROBNP in the last 8760 hours.  Radiological Exams on Admission: Dg Chest Port 1 View  Result Date: 01/25/2018 CLINICAL DATA:  Shortness of breath EXAM: PORTABLE CHEST 1 VIEW COMPARISON:  None. FINDINGS: There is cardiomegaly present with mild pulmonary edema. Small effusions cannot be excluded. Large bore right central venous line tip overlies the lower SVC and tracheostomy is present. IMPRESSION: 1. Cardiomegaly with mild edema. Cannot exclude small pleural effusions. 2. Tracheostomy and right central venous line are present. Electronically Signed   By: Dwyane Dee M.D.   On: 01/25/2018 09:44   Dg Abd Portable 1v  Result Date: 01/24/2018 CLINICAL DATA:  Status post PEG tube placement EXAM: PORTABLE ABDOMEN - 1 VIEW COMPARISON:  None. FINDINGS: KUB obtained after injection of approximately 50 cc of Isovue-300. Contrast is pooling in the fundus. No gross extravasation. IMPRESSION: Contrast within the gastric fundus.  No gross extravasation. Electronically Signed   By: Adrian Prows.D.  On: 01/24/2018 23:00    Assessment/Plan Principal Problem:   Acute on chronic respiratory failure with hypoxia (HCC) Active Problems:   Acute tubular necrosis (HCC)   Lobar pneumonia (HCC)   Severe sepsis with septic shock (HCC)   ARDS (adult respiratory distress syndrome) (HCC)   Acute systolic CHF (congestive heart failure) (HCC)   1. Acute on chronic respiratory failure with hypoxia patient right now is on full vent support as already noted above I want her back on the weaning on T collar as discussed on rounds. 2. Acute renal failure patient is being followed by nephrology  for dialysis 3. Lobar pneumonia treated with antibiotics 4. ARDS follow her oxygen requirement seems to be improving slowly 5. Severe sepsis with shock clinically stable we will continue to follow 6. Acute on chronic systolic heart failure currently appears to be compensated   I have personally seen and evaluated the patient, evaluated laboratory and imaging results, formulated the assessment and plan and placed orders. The Patient requires high complexity decision making for assessment and support.  Case was discussed on Rounds with the Respiratory Therapy Staff  Yevonne Pax, MD [facility] Pulmonary Critical Care Medicine Sleep Medicine

## 2018-01-28 ENCOUNTER — Encounter (HOSPITAL_COMMUNITY): Payer: Self-pay

## 2018-01-28 ENCOUNTER — Other Ambulatory Visit (HOSPITAL_COMMUNITY): Payer: Self-pay

## 2018-01-28 DIAGNOSIS — R042 Hemoptysis: Secondary | ICD-10-CM

## 2018-01-28 DIAGNOSIS — J9621 Acute and chronic respiratory failure with hypoxia: Secondary | ICD-10-CM | POA: Diagnosis not present

## 2018-01-28 DIAGNOSIS — I5021 Acute systolic (congestive) heart failure: Secondary | ICD-10-CM | POA: Diagnosis not present

## 2018-01-28 DIAGNOSIS — J80 Acute respiratory distress syndrome: Secondary | ICD-10-CM | POA: Diagnosis not present

## 2018-01-28 DIAGNOSIS — N170 Acute kidney failure with tubular necrosis: Secondary | ICD-10-CM | POA: Diagnosis not present

## 2018-01-28 LAB — URINALYSIS, ROUTINE W REFLEX MICROSCOPIC
Bilirubin Urine: NEGATIVE
GLUCOSE, UA: NEGATIVE mg/dL
KETONES UR: NEGATIVE mg/dL
LEUKOCYTES UA: NEGATIVE
NITRITE: NEGATIVE
PH: 5 (ref 5.0–8.0)
PROTEIN: 100 mg/dL — AB
SPECIFIC GRAVITY, URINE: 1.015 (ref 1.005–1.030)

## 2018-01-28 LAB — TYPE AND SCREEN
ABO/RH(D): O POS
ANTIBODY SCREEN: NEGATIVE
Unit division: 0
Unit division: 0

## 2018-01-28 LAB — PROTIME-INR
INR: 1.22
Prothrombin Time: 15.3 seconds — ABNORMAL HIGH (ref 11.4–15.2)

## 2018-01-28 LAB — BPAM RBC
BLOOD PRODUCT EXPIRATION DATE: 201905252359
Blood Product Expiration Date: 201905252359
ISSUE DATE / TIME: 201905021044
ISSUE DATE / TIME: 201905021422
Unit Type and Rh: 5100
Unit Type and Rh: 5100

## 2018-01-28 LAB — PTH, INTACT AND CALCIUM
Calcium, Total (PTH): 8.6 mg/dL — ABNORMAL LOW (ref 8.7–10.3)
PTH: 331 pg/mL — ABNORMAL HIGH (ref 15–65)

## 2018-01-28 LAB — HEPATITIS B CORE ANTIBODY, TOTAL: HEP B C TOTAL AB: NEGATIVE

## 2018-01-28 LAB — HEPATITIS B SURFACE ANTIGEN: HEP B S AG: NEGATIVE

## 2018-01-28 LAB — HEPATITIS B SURFACE ANTIBODY, QUANTITATIVE

## 2018-01-28 MED ORDER — IOPAMIDOL (ISOVUE-370) INJECTION 76%
INTRAVENOUS | Status: AC
  Filled 2018-01-28: qty 100

## 2018-01-28 NOTE — Consult Note (Signed)
Subjective:   HPI  The patient is a 62 year old female who [facility] for management of respiratory failure. In reviewing the records she had what appeared to be multicystic organ failure. We are asked to see her in regards to anemia and gastrointestinal bleeding. It was reported that she had had some bloody stools over the last couple of days. She appears to be stable however at this time. In reviewing records at the other facility that she was at she had an EGD on April 11 and then on April 16 had an EGD with PEG placement. No apparent other pathology was mentioned. She had a colonoscopy in March 2016 and she does have diverticulosis. No reported bleeding from around the PEG site but there is reported bleeding from around the trach at times. Patient unable to speak. Husband present in the room and I spoke to him also.  Review of Systems unobtainable  Past Medical History:  Diagnosis Date  . Acute on chronic respiratory failure with hypoxia (HCC) 01/25/2018  . Acute systolic CHF (congestive heart failure) (HCC) 01/25/2018  . Acute tubular necrosis (HCC) 01/25/2018  . ARDS (adult respiratory distress syndrome) (HCC) 01/25/2018  . Lobar pneumonia (HCC) 01/25/2018  . Severe sepsis with septic shock (HCC) 01/25/2018   Past Surgical History:  Procedure Laterality Date  . PEG PLACEMENT    . TRACHEOSTOMY     Social History   Socioeconomic History  . Marital status: Married    Spouse name: Not on file  . Number of children: Not on file  . Years of education: Not on file  . Highest education level: Not on file  Occupational History  . Not on file  Social Needs  . Financial resource strain: Not on file  . Food insecurity:    Worry: Not on file    Inability: Not on file  . Transportation needs:    Medical: Not on file    Non-medical: Not on file  Tobacco Use  . Smoking status: Unknown If Ever Smoked  . Smokeless tobacco: Never  Used  Substance and Sexual Activity  . Alcohol use: Not Currently  . Drug use: Not Currently  . Sexual activity: Not Currently  Lifestyle  . Physical activity:    Days per week: Not on file    Minutes per session: Not on file  . Stress: Not on file  Relationships  . Social connections:    Talks on phone: Not on file    Gets together: Not on file    Attends religious service: Not on file    Active member of club or organization: Not on file    Attends meetings of clubs or organizations: Not on file    Relationship status: Not on file  . Intimate partner violence:    Fear of current or ex partner: Not on file    Emotionally abused: Not on file    Physically abused: Not on file    Forced sexual activity: Not on file  Other Topics Concern  . Not on file  Social History Narrative  . Not on file   Family history is unknown by patient. No current facility-administered medications for this encounter.  Allergies  Allergen Reactions  . Diclofenac Other (See Comments)    Intolerance, elevated LFT Elevated LFT   . Pravastatin Other (See Comments)    Chest pain Chest pain   . Amitriptyline Other (See Comments) and Rash    Unknown   .  Atorvastatin Other (See Comments)    Pain Hurt all over   . Desipramine Other (See Comments)    "felt funny" Felt funny   . Indomethacin Other (See Comments) and Nausea And Vomiting    GI upset   . Oxycodone Other (See Comments)    Caused hallucinations & falling  . Penicillins Hives and Rash    Pt took Keflex in 1996 with no problem   . Phentermine-Topiramate Other (See Comments)    Yeast infection It gave me a really bad yeast infection   . Prednisone Other (See Comments)    Dysphoria dysphoria   . Clarithromycin Other (See Comments)    Hives  . Diphenhydramine Hives     Objective:     There were no vitals taken for this visit.  No distress  Heart regular rhythm  Lungs clear  Abdomen soft and  nontender  Laboratory No components found for: D1    Assessment:     Respiratory failure  Blood loss anemia  Lower GI bleed could be of diverticular origin and this would be the most likely source given her history.      Plan:     For now I would follow clinically. Transfuse blood as necessary. If further bleeding were to occur from the lower GI tract I would get a nuclear medicine GI bleeding scan and if this were positive center for angiography with embolization of the active bleeding site. I would try to avoid invasive endoscopic evaluation at this time unless absolutely necessary for therapeutic intervention. We will follow. Lab Results  Component Value Date   HGB 5.8 (LL) 01/27/2018   HGB 8.1 (L) 01/25/2018   HCT 18.1 (L) 01/27/2018   HCT 26.1 (L) 01/25/2018   ALKPHOS 491 (H) 01/25/2018   AST 108 (H) 01/25/2018   ALT 123 (H) 01/25/2018

## 2018-01-28 NOTE — Progress Notes (Signed)
[facility]  ROUNDING NOTE   Subjective:  Patient remains critically ill. Did receive dialysis yesterday. Will be due for dialysis again tomorrow. Potassium was high predialysis yesterday at 6.0.   Objective:  Vital signs in last 24 hours:  Temperature 97.1 pulse 84 respirations 28 blood pressure 153/76 Physical Exam: General: Critically ill-appearing  Head: Normocephalic, atraumatic. Moist oral mucosal membranes  Eyes: Anicteric  Neck: Tracheostomy in place  Lungs:  Bilateral rhonchi, vent assisted  Heart: S1S2 no rubs  Abdomen:  Soft, nontender, PEG  Extremities: 1+ peripheral edema.  Neurologic: Awake, alert, not consistently following commands  Skin: No lesions  Access: Right internal jugular PermCath    Basic Metabolic Panel: Recent Labs  Lab 01/25/18 0848 01/27/18 0750  NA 133* 137  138  K 5.1 6.0*  6.0*  CL 95* 98*  99*  CO2 19* 16*  15*  GLUCOSE 135* 188*  189*  BUN 121* 220*  219*  CREATININE 5.29* 6.87*  6.89*  CALCIUM 9.1 8.7*  8.7*  8.6*  MG 2.5*  --   PHOS  --  12.6*    Liver Function Tests: Recent Labs  Lab 01/25/18 0848 01/27/18 0750  AST 108*  --   ALT 123*  --   ALKPHOS 491*  --   BILITOT 4.3*  --   PROT 7.3  --   ALBUMIN 2.6* 2.3*   No results for input(s): LIPASE, AMYLASE in the last 168 hours. No results for input(s): AMMONIA in the last 168 hours.  CBC: Recent Labs  Lab 01/25/18 0848 01/27/18 0750  WBC 26.5* 23.8*  HGB 8.1* 5.8*  HCT 26.1* 18.1*  MCV 98.5 96.3  PLT 309 237    Cardiac Enzymes: No results for input(s): CKTOTAL, CKMB, CKMBINDEX, TROPONINI in the last 168 hours.  BNP: Invalid input(s): POCBNP  CBG: No results for input(s): GLUCAP in the last 168 hours.  Microbiology: No results found for this or any previous visit.  Coagulation Studies: Recent Labs    01/25/18 0848 01/27/18 0750  LABPROT 14.6 16.3*  INR 1.15 1.32    Urinalysis: Recent Labs    01/28/18 0300   COLORURINE AMBER*  LABSPEC 1.015  PHURINE 5.0  GLUCOSEU NEGATIVE  HGBUR MODERATE*  BILIRUBINUR NEGATIVE  KETONESUR NEGATIVE  PROTEINUR 100*  NITRITE NEGATIVE  LEUKOCYTESUR NEGATIVE      Imaging: No results found.   Medications:       Assessment/ Plan:  62 y.o. female with a PMHx of congestive heart failure, chronic kidney disease stage III, acute respiratory failure status post tracheostomy placement, hypertension, iron deficiency anemia, diabetes mellitus type 2, hyperlipidemia, obesity, who was admitted to [facility] on 01/24/2018 for ongoing treatment of pneumonia, acute respiratory failure, acute renal failure requiring hemodialysis.  1.  Acute renal fluid/chronic kidney disease stage III baseline creatinine 1.8.  Patient was seen by nephrology at the outside hospital.  She was initially started on CRRT and subsequently transitioned to intermittent hemodialysis. -Patient underwent hemodialysis yesterday.  We will plan for dialysis again tomorrow.  2.  Acute respiratory failure.  Continue ventilatory support at this time.  3.  Anemia of chronic kidney disease.  Hemoglobin was down to 5.8 yesterday.  She was transfused.  Recommend repeating CBC but defer to hospitalist.  4.  Secondary hyperparathyroidism.  Phosphorus was found to be quite high but this was expected.  Most recent serum phosphorus was 12.6.  We will repeat serum phosphorus tomorrow.  5.  Hyperkalemia.  We do plan  to repeat serum potassium tomorrow.  6.  We will continue to monitor her progress along with you.    LOS: 0 Chloris Marcoux 5/3/20198:46 AM

## 2018-01-28 NOTE — Progress Notes (Addendum)
Pulmonary Critical Care Medicine [facility] [place]   PULMONARY SERVICE  PROGRESS NOTE  Date of Service: 01/28/2018  Terri Ellis  ZOX:[id number]  DOB: 1955-10-01   DOA: 01/24/2018  Referring Physician: Carron Curie, MD  HPI: Terri Ellis is a 62 y.o. female seen for follow up of Acute on Chronic Respiratory Failure.  Currently patient is weaning on pressure support she has had some issues with GI bleed hemoglobin was down to 5.8 she is been transfused.  In addition and noticed that she was having some hemoptysis.  Medications: Reviewed on Rounds  Physical Exam:  Vitals: Temperature 97.1 pulse 84 respiratory rate 28 blood pressure 153/76 saturations 99%  Ventilator Settings mode of ventilation pressure support FiO2 40% tidal volume 605 pressure support 12 PEEP 5  . General: Comfortable at this time . Eyes: Grossly normal lids, irises & conjunctiva . ENT: grossly tongue is normal . Neck: no obvious mass . Cardiovascular: S1-S2 normal no gallop or rub . Respiratory: No rhonchi noted . Abdomen: Soft nondistended . Skin: no rash seen on limited exam . Musculoskeletal: not rigid . Psychiatric:unable to assess . Neurologic: no seizure no involuntary movements         Labs on Admission:  Basic Metabolic Panel: Recent Labs  Lab 01/25/18 0848 01/27/18 0750  NA 133* 137  138  K 5.1 6.0*  6.0*  CL 95* 98*  99*  CO2 19* 16*  15*  GLUCOSE 135* 188*  189*  BUN 121* 220*  219*  CREATININE 5.29* 6.87*  6.89*  CALCIUM 9.1 8.7*  8.7*  8.6*  MG 2.5*  --   PHOS  --  12.6*    Liver Function Tests: Recent Labs  Lab 01/25/18 0848 01/27/18 0750  AST 108*  --   ALT 123*  --   ALKPHOS 491*  --   BILITOT 4.3*  --   PROT 7.3  --   ALBUMIN 2.6* 2.3*   No results for input(s): LIPASE, AMYLASE in the last 168 hours. No results for input(s): AMMONIA in the last 168 hours.  CBC: Recent Labs  Lab 01/25/18 0848 01/27/18 0750  WBC 26.5*  23.8*  HGB 8.1* 5.8*  HCT 26.1* 18.1*  MCV 98.5 96.3  PLT 309 237    Cardiac Enzymes: No results for input(s): CKTOTAL, CKMB, CKMBINDEX, TROPONINI in the last 168 hours.  BNP (last 3 results) No results for input(s): BNP in the last 8760 hours.  ProBNP (last 3 results) No results for input(s): PROBNP in the last 8760 hours.  Radiological Exams on Admission: Dg Chest Port 1 View  Result Date: 01/25/2018 CLINICAL DATA:  Shortness of breath EXAM: PORTABLE CHEST 1 VIEW COMPARISON:  None. FINDINGS: There is cardiomegaly present with mild pulmonary edema. Small effusions cannot be excluded. Large bore right central venous line tip overlies the lower SVC and tracheostomy is present. IMPRESSION: 1. Cardiomegaly with mild edema. Cannot exclude small pleural effusions. 2. Tracheostomy and right central venous line are present. Electronically Signed   By: Dwyane Dee M.D.   On: 01/25/2018 09:44   Dg Abd Portable 1v  Result Date: 01/24/2018 CLINICAL DATA:  Status post PEG tube placement EXAM: PORTABLE ABDOMEN - 1 VIEW COMPARISON:  None. FINDINGS: KUB obtained after injection of approximately 50 cc of Isovue-300. Contrast is pooling in the fundus. No gross extravasation. IMPRESSION: Contrast within the gastric fundus.  No gross extravasation. Electronically Signed   By: Jasmine Pang M.D.   On: 01/24/2018 23:00  Assessment/Plan Principal Problem:   Acute on chronic respiratory failure with hypoxia (HCC) Active Problems:   Acute tubular necrosis (HCC)   Lobar pneumonia (HCC)   Severe sepsis with septic shock (HCC)   ARDS (adult respiratory distress syndrome) (HCC)   Acute systolic CHF (congestive heart failure) (HCC)   1. Acute on chronic respiratory failure with hypoxia we will continue with pressure support mode at this time however hold on advancing the wean further and tell her hemoglobin stabilizes. 2. ARDS severe disease we will continue to follow radiologically x-ray was ordered  today 3. Lobar pneumonia treated with antibiotics we will continue with supportive care 4. Acute systolic heart failure diuresis tolerated. 5. Hemoptysis CT scan with contrast was ordered patient will be dialyzed tomorrow   I have personally seen and evaluated the patient, evaluated laboratory and imaging results, formulated the assessment and plan and placed orders. The Patient requires high complexity decision making for assessment and support.  Case was discussed on Rounds with the Respiratory Therapy Staff time spent 35 minutes  Yevonne Pax, MD [facility] Pulmonary Critical Care Medicine Sleep Medicine

## 2018-01-29 LAB — RENAL FUNCTION PANEL
ALBUMIN: 2.5 g/dL — AB (ref 3.5–5.0)
ANION GAP: 20 — AB (ref 5–15)
BUN: 121 mg/dL — AB (ref 6–20)
CALCIUM: 8.8 mg/dL — AB (ref 8.9–10.3)
CO2: 20 mmol/L — AB (ref 22–32)
CREATININE: 4.76 mg/dL — AB (ref 0.44–1.00)
Chloride: 92 mmol/L — ABNORMAL LOW (ref 101–111)
GFR calc Af Amer: 10 mL/min — ABNORMAL LOW (ref >60)
GFR calc non Af Amer: 9 mL/min — ABNORMAL LOW (ref >60)
GLUCOSE: 153 mg/dL — AB (ref 65–99)
Phosphorus: 10.2 mg/dL — ABNORMAL HIGH (ref 2.5–4.6)
Potassium: 4.4 mmol/L (ref 3.5–5.1)
SODIUM: 132 mmol/L — AB (ref 135–145)

## 2018-01-29 LAB — CBC
HEMATOCRIT: 22.8 % — AB (ref 36.0–46.0)
Hemoglobin: 7.3 g/dL — ABNORMAL LOW (ref 12.0–15.0)
MCH: 30.5 pg (ref 26.0–34.0)
MCHC: 32 g/dL (ref 30.0–36.0)
MCV: 95.4 fL (ref 78.0–100.0)
Platelets: 177 10*3/uL (ref 150–400)
RBC: 2.39 MIL/uL — ABNORMAL LOW (ref 3.87–5.11)
RDW: 21.2 % — AB (ref 11.5–15.5)
WBC: 23.2 10*3/uL — ABNORMAL HIGH (ref 4.0–10.5)

## 2018-01-29 LAB — URINE CULTURE: Culture: NO GROWTH

## 2018-01-29 NOTE — Progress Notes (Signed)
[facility] Progress Note  Terri Ellis 62 y.o. 21-Feb-1956  CC:  Rectal bleeding   Subjective: patient not able to provide history. Discussed with the nursing staff. No acute GI events overnight. No evidence of bleeding since last night. No bowel movement this morning.  ROS : not able to obtain   Objective: Vital signs in last 24 hours: There were no vitals filed for this visit.  Physical Exam:  Gen. Awake.no acute distress ABD :  Soft, nontender, nondistended, bowel sounds present.  Lab Results: Recent Labs    01/27/18 0750 01/29/18 0724  NA 137  138 132*  K 6.0*  6.0* 4.4  CL 98*  99* 92*  CO2 16*  15* 20*  GLUCOSE 188*  189* 153*  BUN 220*  219* 121*  CREATININE 6.87*  6.89* 4.76*  CALCIUM 8.7*  8.7*  8.6* 8.8*  PHOS 12.6* 10.2*   Recent Labs    01/27/18 0750 01/29/18 0724  ALBUMIN 2.3* 2.5*   Recent Labs    01/27/18 0750 01/29/18 0724  WBC 23.8* 23.2*  HGB 5.8* 7.3*  HCT 18.1* 22.8*  MCV 96.3 95.4  PLT 237 177   Recent Labs    01/27/18 0750 01/28/18 1445  LABPROT 16.3* 15.3*  INR 1.32 1.22      Assessment/Plan: - rectal bleeding. Resolved. Most likely diverticular bleed. - multiple co morbidities.  Recommendations -------------------------- - No further evidence of active bleeding. - Recommend  changing Protonix drip to IV twice a day PPI. Consider DC octreotide. - monitor H&H. Transfuse to keep hemoglobin around 7-8. If evidence of further bleeding or drop in hemoglobin, recommend GI bleeding scan for further evaluation and if positive interventional radiology consult for possible embolization  - GI will sign off. Call us back if needed   Kathi Der MD, FACP 01/29/2018, 8:47 AM  Contact #  [phone]

## 2018-01-30 DIAGNOSIS — J80 Acute respiratory distress syndrome: Secondary | ICD-10-CM | POA: Diagnosis not present

## 2018-01-30 DIAGNOSIS — J181 Lobar pneumonia, unspecified organism: Secondary | ICD-10-CM | POA: Diagnosis not present

## 2018-01-30 DIAGNOSIS — J9621 Acute and chronic respiratory failure with hypoxia: Secondary | ICD-10-CM | POA: Diagnosis not present

## 2018-01-30 DIAGNOSIS — A419 Sepsis, unspecified organism: Secondary | ICD-10-CM | POA: Diagnosis not present

## 2018-01-30 DIAGNOSIS — I5021 Acute systolic (congestive) heart failure: Secondary | ICD-10-CM | POA: Diagnosis not present

## 2018-01-30 DIAGNOSIS — N170 Acute kidney failure with tubular necrosis: Secondary | ICD-10-CM | POA: Diagnosis not present

## 2018-01-30 DIAGNOSIS — R6521 Severe sepsis with septic shock: Secondary | ICD-10-CM | POA: Diagnosis not present

## 2018-01-30 NOTE — Progress Notes (Signed)
Pulmonary Critical Care Medicine [facility] [place]   PULMONARY SERVICE  PROGRESS NOTE  Date of Service: 01/30/2018  Norie Latendresse  ZOX:[id number]  DOB: 1955/12/01   DOA: 01/24/2018  Referring Physician: Carron Curie, MD  HPI: Blakleigh Straw is a 62 y.o. female seen for follow up of Acute on Chronic Respiratory Failure.  Patient remains on T collar currently is on 40% oxygen.  Has been doing well as tolerated  Medications: Reviewed on Rounds  Physical Exam:  Vitals: Temperature 98.0 pulse 94 respiratory 37 blood pressure 132/77 saturations 100%  Ventilator Settings off of the ventilator on T collar FiO2 40%  . General: Comfortable at this time . Eyes: Grossly normal lids, irises & conjunctiva . ENT: grossly tongue is normal . Neck: no obvious mass . Cardiovascular: S1-S2 normal no gallop . Respiratory: No rhonchi expansion is equal . Abdomen: Soft and nontender . Skin: no rash seen on limited exam . Musculoskeletal: not rigid . Psychiatric:unable to assess . Neurologic: no seizure no involuntary movements         Labs on Admission:  Basic Metabolic Panel: Recent Labs  Lab 01/25/18 0848 01/27/18 0750 01/29/18 0724  NA 133* 137  138 132*  K 5.1 6.0*  6.0* 4.4  CL 95* 98*  99* 92*  CO2 19* 16*  15* 20*  GLUCOSE 135* 188*  189* 153*  BUN 121* 220*  219* 121*  CREATININE 5.29* 6.87*  6.89* 4.76*  CALCIUM 9.1 8.7*  8.7*  8.6* 8.8*  MG 2.5*  --   --   PHOS  --  12.6* 10.2*    Liver Function Tests: Recent Labs  Lab 01/25/18 0848 01/27/18 0750 01/29/18 0724  AST 108*  --   --   ALT 123*  --   --   ALKPHOS 491*  --   --   BILITOT 4.3*  --   --   PROT 7.3  --   --   ALBUMIN 2.6* 2.3* 2.5*   No results for input(s): LIPASE, AMYLASE in the last 168 hours. No results for input(s): AMMONIA in the last 168 hours.  CBC: Recent Labs  Lab 01/25/18 0848 01/27/18 0750 01/29/18 0724  WBC 26.5* 23.8* 23.2*  HGB 8.1* 5.8*  7.3*  HCT 26.1* 18.1* 22.8*  MCV 98.5 96.3 95.4  PLT 309 237 177    Cardiac Enzymes: No results for input(s): CKTOTAL, CKMB, CKMBINDEX, TROPONINI in the last 168 hours.  BNP (last 3 results) No results for input(s): BNP in the last 8760 hours.  ProBNP (last 3 results) No results for input(s): PROBNP in the last 8760 hours.  Radiological Exams on Admission: Dg Chest Port 1 View  Result Date: 01/28/2018 CLINICAL DATA:  Chest pain. EXAM: PORTABLE CHEST 1 VIEW COMPARISON:  01/25/2018. FINDINGS: Tracheostomy tube noted stable position. Dialysis catheter and right PICC line in stable position. Stable cardiomegaly. Unchanged diffuse bilateral pulmonary infiltrates most consistent pulmonary edema. Small pleural effusions again most likely present. No pneumothorax. IMPRESSION: 1.  Lines and tubes in stable position. 2. Persistent changes of congestive heart failure bilateral pulmonary edema small pleural effusions. No significant interim change from prior exam. Electronically Signed   By: Maisie Fus  Register   On: 01/28/2018 12:53    Assessment/Plan Principal Problem:   Acute on chronic respiratory failure with hypoxia (HCC) Active Problems:   Acute tubular necrosis (HCC)   Lobar pneumonia (HCC)   Severe sepsis with septic shock (HCC)   ARDS (adult respiratory distress syndrome) (HCC)  Acute systolic CHF (congestive heart failure) (HCC)   1. Acute on chronic respiratory failure with hypoxia we will continue with T collar trials as tolerated.  Titrate oxygen down as tolerated 2. Acute tubular necrosis patient is being followed by nephrology will follow the recommendations. 3. ARDS clinically seems to be showing some improvement wean FiO2 as tolerated 4. Severe sepsis with shock not requiring any pressors we will continue present management 5. Acute systolic heart failure follow the fluid balance very closely   I have personally seen and evaluated the patient, evaluated laboratory and  imaging results, formulated the assessment and plan and placed orders. The Patient requires high complexity decision making for assessment and support.  Case was discussed on Rounds with the Respiratory Therapy Staff  Yevonne Pax, MD [facility] Pulmonary Critical Care Medicine Sleep Medicine

## 2018-01-31 DIAGNOSIS — N170 Acute kidney failure with tubular necrosis: Secondary | ICD-10-CM | POA: Diagnosis not present

## 2018-01-31 DIAGNOSIS — J9621 Acute and chronic respiratory failure with hypoxia: Secondary | ICD-10-CM | POA: Diagnosis not present

## 2018-01-31 DIAGNOSIS — I5021 Acute systolic (congestive) heart failure: Secondary | ICD-10-CM | POA: Diagnosis not present

## 2018-01-31 DIAGNOSIS — J80 Acute respiratory distress syndrome: Secondary | ICD-10-CM | POA: Diagnosis not present

## 2018-01-31 LAB — CBC
HCT: 22.7 % — ABNORMAL LOW (ref 36.0–46.0)
HEMATOCRIT: 22.5 % — AB (ref 36.0–46.0)
HEMOGLOBIN: 8.1 g/dL — AB (ref 12.0–15.0)
Hemoglobin: 7.6 g/dL — ABNORMAL LOW (ref 12.0–15.0)
MCH: 33 pg (ref 26.0–34.0)
MCH: 35.8 pg — ABNORMAL HIGH (ref 26.0–34.0)
MCHC: 33.5 g/dL (ref 30.0–36.0)
MCHC: 36 g/dL (ref 30.0–36.0)
MCV: 98.7 fL (ref 78.0–100.0)
MCV: 99.6 fL (ref 78.0–100.0)
PLATELETS: 155 10*3/uL (ref 150–400)
Platelets: 184 10*3/uL (ref 150–400)
RBC: 2.3 MIL/uL — ABNORMAL LOW (ref 3.87–5.11)
RBC: 2.26 MIL/uL — AB (ref 3.87–5.11)
RDW: 22 % — ABNORMAL HIGH (ref 11.5–15.5)
RDW: 21.9 % — ABNORMAL HIGH (ref 11.5–15.5)
WBC: 23.5 10*3/uL — ABNORMAL HIGH (ref 4.0–10.5)

## 2018-01-31 LAB — BASIC METABOLIC PANEL
ANION GAP: 19 — AB (ref 5–15)
BUN: 71 mg/dL — AB (ref 6–20)
CO2: 17 mmol/L — ABNORMAL LOW (ref 22–32)
Calcium: 8.5 mg/dL — ABNORMAL LOW (ref 8.9–10.3)
Chloride: 96 mmol/L — ABNORMAL LOW (ref 101–111)
Creatinine, Ser: 3.98 mg/dL — ABNORMAL HIGH (ref 0.44–1.00)
GFR calc Af Amer: 13 mL/min — ABNORMAL LOW (ref >60)
GFR calc non Af Amer: 11 mL/min — ABNORMAL LOW (ref >60)
Glucose, Bld: 159 mg/dL — ABNORMAL HIGH (ref 65–99)
POTASSIUM: 5.7 mmol/L — AB (ref 3.5–5.1)
SODIUM: 132 mmol/L — AB (ref 135–145)

## 2018-01-31 LAB — VANCOMYCIN, TROUGH: VANCOMYCIN TR: 17 ug/mL (ref 15–20)

## 2018-01-31 NOTE — Progress Notes (Signed)
[facility]  ROUNDING NOTE   Subjective:  Patient seen at bedside. Awake and alert. Due for dialysis again tomorrow.   Objective:  Vital signs in last 24 hours:  Temperature 97.9 pulse 84 respirations 22 blood pressure 148/77 Physical Exam: General: Critically ill-appearing  Head: Normocephalic, atraumatic. Moist oral mucosal membranes  Eyes: Anicteric  Neck: Tracheostomy in place  Lungs:  Bilateral rhonchi  Heart: S1S2 no rubs  Abdomen:  Soft, nontender, PEG  Extremities: 1+ peripheral edema.  Neurologic: Awake, alert, nods yes/no to questions  Skin: No lesions  Access: Right internal jugular PermCath    Basic Metabolic Panel: Recent Labs  Lab 01/25/18 0848 01/27/18 0750 01/29/18 0724 01/31/18 0439  NA 133* 137  138 132* 132*  K 5.1 6.0*  6.0* 4.4 5.7*  CL 95* 98*  99* 92* 96*  CO2 19* 16*  15* 20* 17*  GLUCOSE 135* 188*  189* 153* 159*  BUN 121* 220*  219* 121* 71*  CREATININE 5.29* 6.87*  6.89* 4.76* 3.98*  CALCIUM 9.1 8.7*  8.7*  8.6* 8.8* 8.5*  MG 2.5*  --   --   --   PHOS  --  12.6* 10.2*  --     Liver Function Tests: Recent Labs  Lab 01/25/18 0848 01/27/18 0750 01/29/18 0724  AST 108*  --   --   ALT 123*  --   --   ALKPHOS 491*  --   --   BILITOT 4.3*  --   --   PROT 7.3  --   --   ALBUMIN 2.6* 2.3* 2.5*   No results for input(s): LIPASE, AMYLASE in the last 168 hours. No results for input(s): AMMONIA in the last 168 hours.  CBC: Recent Labs  Lab 01/25/18 0848 01/27/18 0750 01/29/18 0724 01/31/18 0439 01/31/18 0939  WBC 26.5* 23.8* 23.2* SPECIMEN CLOTTED 23.5*  HGB 8.1* 5.8* 7.3* 7.6* 8.1*  HCT 26.1* 18.1* 22.8* 22.7* 22.5*  MCV 98.5 96.3 95.4 98.7 99.6  PLT 309 237 177 155 184    Cardiac Enzymes: No results for input(s): CKTOTAL, CKMB, CKMBINDEX, TROPONINI in the last 168 hours.  BNP: Invalid input(s): POCBNP  CBG: No results for input(s): GLUCAP in the last 168 hours.  Microbiology: Results for  orders placed or performed during the hospital encounter of 01/24/18  Culture, Urine     Status: None   Collection Time: 01/28/18  3:00 AM  Result Value Ref Range Status   Specimen Description URINE, RANDOM  Final   Special Requests NONE  Final   Culture   Final    NO GROWTH Performed at [facility], [address], [place], [place] [address]    Report Status 01/29/2018 FINAL  Final    Coagulation Studies: No results for input(s): LABPROT, INR in the last 72 hours.  Urinalysis: No results for input(s): COLORURINE, LABSPEC, PHURINE, GLUCOSEU, HGBUR, BILIRUBINUR, KETONESUR, PROTEINUR, UROBILINOGEN, NITRITE, LEUKOCYTESUR in the last 72 hours.  Invalid input(s): APPERANCEUR    Imaging: No results found.   Medications:       Assessment/ Plan:  62 y.o. female with a PMHx of congestive heart failure, chronic kidney disease stage III, acute respiratory failure status post tracheostomy placement, hypertension, iron deficiency anemia, diabetes mellitus type 2, hyperlipidemia, obesity, who was admitted to [facility] on 01/24/2018 for ongoing treatment of pneumonia, acute respiratory failure, acute renal failure requiring hemodialysis.  1.  Acute renal fluid/chronic kidney disease stage III baseline creatinine 1.8.  Patient was  seen by nephrology at the outside hospital.  She was initially started on CRRT and subsequently transitioned to intermittent hemodialysis. -Patient due for hemodialysis tomorrow.  Orders to be prepared.  2.  Acute respiratory failure.  Tracheostomy in place and functional.  Continue current pulmonary support.  3.  Anemia of chronic kidney disease.  Hemoglobin up to 8.1 posttransfusion.  Continue to monitor.  4.  Secondary hyperparathyroidism.  Phosphorus down to 10.2 at last check.  Repeat serum phosphorus tomorrow.  5.  Hyperkalemia.  Potassium fluctuating a bit.  Serum potassium yesterday was found to be 5.7.  We will dialyze the patient against  a 2K bath tomorrow.      LOS: 0 Terri Ellis 5/6/20193:19 PM

## 2018-01-31 NOTE — Progress Notes (Signed)
Pulmonary Critical Care Medicine [facility] [place]   PULMONARY SERVICE  PROGRESS NOTE  Date of Service: 01/31/2018  Tayloranne Lekas  ZOX:[id number]  DOB: [birth year]   DOA: 01/24/2018  Referring Physician: Carron Curie, MD  HPI: Jenisse Vullo is a 62 y.o. female seen for follow up of Acute on Chronic Respiratory Failure.  Currently is on T collar off the ventilator for more than 24 hours  Medications: Reviewed on Rounds  Physical Exam:  Vitals: Temperature 97.6 pulse 88 respiratory rate 18 blood pressure 164/92 saturation 98%  Ventilator Settings off of the ventilator on T collar  . General: Comfortable at this time . Eyes: Grossly normal lids, irises & conjunctiva . ENT: grossly tongue is normal . Neck: no obvious mass . Cardiovascular: S1-S2 normal no gallop . Respiratory: No rhonchi noted . Abdomen: Soft nontender . Skin: no rash seen on limited exam . Musculoskeletal: not rigid . Psychiatric:unable to assess . Neurologic: no seizure no involuntary movements         Labs on Admission:  Basic Metabolic Panel: Recent Labs  Lab 01/25/18 0848 01/27/18 0750 01/29/18 0724 01/31/18 0439  NA 133* 137  138 132* 132*  K 5.1 6.0*  6.0* 4.4 5.7*  CL 95* 98*  99* 92* 96*  CO2 19* 16*  15* 20* 17*  GLUCOSE 135* 188*  189* 153* 159*  BUN 121* 220*  219* 121* 71*  CREATININE 5.29* 6.87*  6.89* 4.76* 3.98*  CALCIUM 9.1 8.7*  8.7*  8.6* 8.8* 8.5*  MG 2.5*  --   --   --   PHOS  --  12.6* 10.2*  --     Liver Function Tests: Recent Labs  Lab 01/25/18 0848 01/27/18 0750 01/29/18 0724  AST 108*  --   --   ALT 123*  --   --   ALKPHOS 491*  --   --   BILITOT 4.3*  --   --   PROT 7.3  --   --   ALBUMIN 2.6* 2.3* 2.5*   No results for input(s): LIPASE, AMYLASE in the last 168 hours. No results for input(s): AMMONIA in the last 168 hours.  CBC: Recent Labs  Lab 01/25/18 0848 01/27/18 0750 01/29/18 0724 01/31/18 0439  01/31/18 0939  WBC 26.5* 23.8* 23.2* SPECIMEN CLOTTED 23.5*  HGB 8.1* 5.8* 7.3* 7.6* 8.1*  HCT 26.1* 18.1* 22.8* 22.7* 22.5*  MCV 98.5 96.3 95.4 98.7 99.6  PLT 309 237 177 155 184    Cardiac Enzymes: No results for input(s): CKTOTAL, CKMB, CKMBINDEX, TROPONINI in the last 168 hours.  BNP (last 3 results) No results for input(s): BNP in the last 8760 hours.  ProBNP (last 3 results) No results for input(s): PROBNP in the last 8760 hours.  Radiological Exams on Admission: Dg Chest Port 1 View  Result Date: 01/28/2018 CLINICAL DATA:  Chest pain. EXAM: PORTABLE CHEST 1 VIEW COMPARISON:  01/25/2018. FINDINGS: Tracheostomy tube noted stable position. Dialysis catheter and right PICC line in stable position. Stable cardiomegaly. Unchanged diffuse bilateral pulmonary infiltrates most consistent pulmonary edema. Small pleural effusions again most likely present. No pneumothorax. IMPRESSION: 1.  Lines and tubes in stable position. 2. Persistent changes of congestive heart failure bilateral pulmonary edema small pleural effusions. No significant interim change from prior exam. Electronically Signed   By: Maisie Fus  Register   On: 01/28/2018 12:53    Assessment/Plan Principal Problem:   Acute on chronic respiratory failure with hypoxia (HCC) Active Problems:   Acute tubular  necrosis (HCC)   Lobar pneumonia (HCC)   Severe sepsis with septic shock (HCC)   ARDS (adult respiratory distress syndrome) (HCC)   Acute systolic CHF (congestive heart failure) (HCC)   1. Acute on chronic respiratory failure with hypoxia we will continue with T collar trials soon as mentioned has been off for more than 24 hours 2. Acute tubular necrosis follow with nephrology will continue with dialysis as tolerated 3. ARDS clinically resolved we will continue to follow 4. Acute systolic heart failure follow fluid status closely 5. Lobar pneumonia treated with antibiotics   I have personally seen and evaluated the  patient, evaluated laboratory and imaging results, formulated the assessment and plan and placed orders. The Patient requires high complexity decision making for assessment and support.  Case was discussed on Rounds with the Respiratory Therapy Staff  Yevonne Pax, MD [facility] Pulmonary Critical Care Medicine Sleep Medicine

## 2018-02-01 DIAGNOSIS — A419 Sepsis, unspecified organism: Secondary | ICD-10-CM | POA: Diagnosis not present

## 2018-02-01 DIAGNOSIS — J181 Lobar pneumonia, unspecified organism: Secondary | ICD-10-CM | POA: Diagnosis not present

## 2018-02-01 DIAGNOSIS — J80 Acute respiratory distress syndrome: Secondary | ICD-10-CM | POA: Diagnosis not present

## 2018-02-01 DIAGNOSIS — J9621 Acute and chronic respiratory failure with hypoxia: Secondary | ICD-10-CM | POA: Diagnosis not present

## 2018-02-01 DIAGNOSIS — N170 Acute kidney failure with tubular necrosis: Secondary | ICD-10-CM | POA: Diagnosis not present

## 2018-02-01 DIAGNOSIS — R6521 Severe sepsis with septic shock: Secondary | ICD-10-CM | POA: Diagnosis not present

## 2018-02-01 DIAGNOSIS — I5021 Acute systolic (congestive) heart failure: Secondary | ICD-10-CM | POA: Diagnosis not present

## 2018-02-01 LAB — RENAL FUNCTION PANEL
Albumin: 2.6 g/dL — ABNORMAL LOW (ref 3.5–5.0)
Anion gap: 16 — ABNORMAL HIGH (ref 5–15)
BUN: 92 mg/dL — ABNORMAL HIGH (ref 6–20)
CO2: 22 mmol/L (ref 22–32)
Calcium: 8.9 mg/dL (ref 8.9–10.3)
Chloride: 98 mmol/L — ABNORMAL LOW (ref 101–111)
Creatinine, Ser: 5.09 mg/dL — ABNORMAL HIGH (ref 0.44–1.00)
GFR calc Af Amer: 10 mL/min — ABNORMAL LOW (ref >60)
GFR calc non Af Amer: 8 mL/min — ABNORMAL LOW (ref >60)
Glucose, Bld: 134 mg/dL — ABNORMAL HIGH (ref 65–99)
Phosphorus: 9.5 mg/dL — ABNORMAL HIGH (ref 2.5–4.6)
Potassium: 4.7 mmol/L (ref 3.5–5.1)
Sodium: 136 mmol/L (ref 135–145)

## 2018-02-01 LAB — CBC
HEMATOCRIT: 22.8 % — AB (ref 36.0–46.0)
Hemoglobin: 7.2 g/dL — ABNORMAL LOW (ref 12.0–15.0)
MCH: 31.6 pg (ref 26.0–34.0)
MCHC: 31.6 g/dL (ref 30.0–36.0)
MCV: 100 fL (ref 78.0–100.0)
Platelets: 216 10*3/uL (ref 150–400)
RBC: 2.28 MIL/uL — AB (ref 3.87–5.11)
RDW: 21.9 % — ABNORMAL HIGH (ref 11.5–15.5)
WBC: 21.1 10*3/uL — ABNORMAL HIGH (ref 4.0–10.5)

## 2018-02-01 NOTE — Progress Notes (Signed)
Pulmonary Critical Care Medicine [facility] [place]   PULMONARY SERVICE  PROGRESS NOTE  Date of Service: 02/01/2018  Kiasha Bellin  XTG:[id number]  DOB: 10-08-1955   DOA: 01/24/2018  Referring Physician: Carron Curie, MD  HPI: Terri Ellis is a 62 y.o. female seen for follow up of Acute on Chronic Respiratory Failure.  Currently is on T collar has been tolerating fairly well right now is 140% oxygen is been off the ventilator for more than 48 hours  Medications: Reviewed on Rounds  Physical Exam:  Vitals: Temperature 97.5 pulse 92 respiratory rate 21 blood pressure 144/91 saturations 95%  Ventilator Settings off of the ventilator on T collar  . General: Comfortable at this time . Eyes: Grossly normal lids, irises & conjunctiva . ENT: grossly tongue is normal . Neck: no obvious mass . Cardiovascular: S1-S2 normal no gallop or rub . Respiratory: No rhonchi expansion equal . Abdomen: Soft nontender . Skin: no rash seen on limited exam . Musculoskeletal: not rigid . Psychiatric:unable to assess . Neurologic: no seizure no involuntary movements         Labs on Admission:  Basic Metabolic Panel: Recent Labs  Lab 01/27/18 0750 01/29/18 0724 01/31/18 0439 02/01/18 0657  NA 137  138 132* 132* 136  K 6.0*  6.0* 4.4 5.7* 4.7  CL 98*  99* 92* 96* 98*  CO2 16*  15* 20* 17* 22  GLUCOSE 188*  189* 153* 159* 134*  BUN 220*  219* 121* 71* 92*  CREATININE 6.87*  6.89* 4.76* 3.98* 5.09*  CALCIUM 8.7*  8.7*  8.6* 8.8* 8.5* 8.9  PHOS 12.6* 10.2*  --  9.5*    Liver Function Tests: Recent Labs  Lab 01/27/18 0750 01/29/18 0724 02/01/18 0657  ALBUMIN 2.3* 2.5* 2.6*   No results for input(s): LIPASE, AMYLASE in the last 168 hours. No results for input(s): AMMONIA in the last 168 hours.  CBC: Recent Labs  Lab 01/27/18 0750 01/29/18 0724 01/31/18 0439 01/31/18 0939 02/01/18 0657  WBC 23.8* 23.2* SPECIMEN CLOTTED 23.5* 21.1*  HGB  5.8* 7.3* 7.6* 8.1* 7.2*  HCT 18.1* 22.8* 22.7* 22.5* 22.8*  MCV 96.3 95.4 98.7 99.6 100.0  PLT 237 177 155 184 216    Cardiac Enzymes: No results for input(s): CKTOTAL, CKMB, CKMBINDEX, TROPONINI in the last 168 hours.  BNP (last 3 results) No results for input(s): BNP in the last 8760 hours.  ProBNP (last 3 results) No results for input(s): PROBNP in the last 8760 hours.  Radiological Exams on Admission: No results found.  Assessment/Plan Principal Problem:   Acute on chronic respiratory failure with hypoxia (HCC) Active Problems:   Acute tubular necrosis (HCC)   Lobar pneumonia (HCC)   Severe sepsis with septic shock (HCC)   ARDS (adult respiratory distress syndrome) (HCC)   Acute systolic CHF (congestive heart failure) (HCC)   1. Acute on chronic respiratory failure with hypoxia patient is weaning right now off the ventilator currently is on 48% FiO2 we will continue with weaning as tolerated continue secretion management pulmonary toilet 2. Acute tubular necrosis followed by nephrology dialyze as needed 3. Lobar pneumonia treated with antibiotics we will continue with supportive care 4. Severe sepsis with shock right now is hemodynamically stable 5. ARDS clinically improving will follow along  6. acute systolic heart failure fluid status per nephrology   I have personally seen and evaluated the patient, evaluated laboratory and imaging results, formulated the assessment and plan and placed orders. The Patient requires  high complexity decision making for assessment and support.  Case was discussed on Rounds with the Respiratory Therapy Staff  Allyne Gee, MD [facility] Pulmonary Critical Care Medicine Sleep Medicine

## 2018-02-02 ENCOUNTER — Other Ambulatory Visit (HOSPITAL_COMMUNITY): Payer: Self-pay

## 2018-02-02 DIAGNOSIS — J9621 Acute and chronic respiratory failure with hypoxia: Secondary | ICD-10-CM | POA: Diagnosis not present

## 2018-02-02 DIAGNOSIS — N170 Acute kidney failure with tubular necrosis: Secondary | ICD-10-CM | POA: Diagnosis not present

## 2018-02-02 DIAGNOSIS — I5021 Acute systolic (congestive) heart failure: Secondary | ICD-10-CM | POA: Diagnosis not present

## 2018-02-02 DIAGNOSIS — J80 Acute respiratory distress syndrome: Secondary | ICD-10-CM | POA: Diagnosis not present

## 2018-02-02 NOTE — Progress Notes (Signed)
[facility]  ROUNDING NOTE   Subjective:  Patient seen and evaluated during dialysis today. Due to emergency dialysis treatments necessary at [facility] patient's dialysis was delayed till today.    Objective:  Vital signs in last 24 hours:  Temperature 98.3 pulse 94 respirations 39 blood pressure 112/56  Physical Exam: General: Critically ill-appearing  Head: Normocephalic, atraumatic. Moist oral mucosal membranes  Eyes: Anicteric  Neck: Tracheostomy in place  Lungs:  Bilateral rhonchi  Heart: S1S2 no rubs  Abdomen:  Soft, nontender, PEG  Extremities: 1+ peripheral edema.  Neurologic: Awake, alert, nods yes/no to questions  Skin: No lesions  Access: Right internal jugular PermCath    Basic Metabolic Panel: Recent Labs  Lab 01/27/18 0750 01/29/18 0724 01/31/18 0439 02/01/18 0657  NA 137  138 132* 132* 136  K 6.0*  6.0* 4.4 5.7* 4.7  CL 98*  99* 92* 96* 98*  CO2 16*  15* 20* 17* 22  GLUCOSE 188*  189* 153* 159* 134*  BUN 220*  219* 121* 71* 92*  CREATININE 6.87*  6.89* 4.76* 3.98* 5.09*  CALCIUM 8.7*  8.7*  8.6* 8.8* 8.5* 8.9  PHOS 12.6* 10.2*  --  9.5*    Liver Function Tests: Recent Labs  Lab 01/27/18 0750 01/29/18 0724 02/01/18 0657  ALBUMIN 2.3* 2.5* 2.6*   No results for input(s): LIPASE, AMYLASE in the last 168 hours. No results for input(s): AMMONIA in the last 168 hours.  CBC: Recent Labs  Lab 01/27/18 0750 01/29/18 0724 01/31/18 0439 01/31/18 0939 02/01/18 0657  WBC 23.8* 23.2* SPECIMEN CLOTTED 23.5* 21.1*  HGB 5.8* 7.3* 7.6* 8.1* 7.2*  HCT 18.1* 22.8* 22.7* 22.5* 22.8*  MCV 96.3 95.4 98.7 99.6 100.0  PLT 237 177 155 184 216    Cardiac Enzymes: No results for input(s): CKTOTAL, CKMB, CKMBINDEX, TROPONINI in the last 168 hours.  BNP: Invalid input(s): POCBNP  CBG: No results for input(s): GLUCAP in the last 168 hours.  Microbiology: Results for orders placed or performed during the hospital  encounter of 01/24/18  Culture, Urine     Status: None   Collection Time: 01/28/18  3:00 AM  Result Value Ref Range Status   Specimen Description URINE, RANDOM  Final   Special Requests NONE  Final   Culture   Final    NO GROWTH Performed at [facility], [address], [place], [place] [address]    Report Status 01/29/2018 FINAL  Final    Coagulation Studies: No results for input(s): LABPROT, INR in the last 72 hours.  Urinalysis: No results for input(s): COLORURINE, LABSPEC, PHURINE, GLUCOSEU, HGBUR, BILIRUBINUR, KETONESUR, PROTEINUR, UROBILINOGEN, NITRITE, LEUKOCYTESUR in the last 72 hours.  Invalid input(s): APPERANCEUR    Imaging: No results found.   Medications:       Assessment/ Plan:  62 y.o. female with a PMHx of congestive heart failure, chronic kidney disease stage III, acute respiratory failure status post tracheostomy placement, hypertension, iron deficiency anemia, diabetes mellitus type 2, hyperlipidemia, obesity, who was admitted to [facility] on 01/24/2018 for ongoing treatment of pneumonia, acute respiratory failure, acute renal failure requiring hemodialysis.  1.  Acute renal fluid/chronic kidney disease stage III baseline creatinine 1.8.  Patient was seen by nephrology at the outside hospital.  She was initially started on CRRT and subsequently transitioned to intermittent hemodialysis. -The patient's dialysis treatment was delayed till today secondary to emergency treatments that had to be performed at [facility].  We will plan for  completion of dialysis today and we will also plan for dialysis tomorrow.  2.  Acute respiratory failure.  Patient has tracheostomy in place.  Pulmonary/critical care following on the case.  3.  Anemia of chronic kidney disease.  Hemoglobin has drifted down again to 7.2.  She has already required transfusion this admission.  Further evaluation per hospitalist.  4.  Secondary hyperparathyroidism. serum  phosphorus down to 9.5.  We will repeat this tomorrow as well.  5.  Hyperkalemia.  Potassium down to 4.7 today.  Repeat values tomorrow.      LOS: 0 Eva Griffo 5/8/20192:41 PM

## 2018-02-02 NOTE — Progress Notes (Signed)
Pulmonary Critical Care Medicine [facility] [place]   PULMONARY SERVICE  PROGRESS NOTE  Date of Service: 02/02/2018  Terri Ellis  ZOX:[id number]  DOB: [birth year]   DOA: 01/24/2018  Referring Physician: Carron Curie, MD  HPI: Terri Ellis is a 62 y.o. female seen for follow up of Acute on Chronic Respiratory Failure.  Patient is on T collar right now has been failing with PMV trials.  Currently is on 35% oxygen he seems to be comfortable  Medications: Reviewed on Rounds  Physical Exam:  Vitals: Temperature 97.3 pulse 96 respiratory rate 36 blood pressure 104 64 saturations 95%  Ventilator Settings aerosolized T-bar FiO2 35%  . General: Comfortable at this time . Eyes: Grossly normal lids, irises & conjunctiva . ENT: grossly tongue is normal . Neck: no obvious mass . Cardiovascular: S1-S2 normal no gallop or rub . Respiratory: Scattered rhonchi expansion is equal . Abdomen: Soft and nontender . Skin: no rash seen on limited exam . Musculoskeletal: not rigid . Psychiatric:unable to assess . Neurologic: no seizure no involuntary movements         Labs on Admission:  Basic Metabolic Panel: Recent Labs  Lab 01/27/18 0750 01/29/18 0724 01/31/18 0439 02/01/18 0657  NA 137  138 132* 132* 136  K 6.0*  6.0* 4.4 5.7* 4.7  CL 98*  99* 92* 96* 98*  CO2 16*  15* 20* 17* 22  GLUCOSE 188*  189* 153* 159* 134*  BUN 220*  219* 121* 71* 92*  CREATININE 6.87*  6.89* 4.76* 3.98* 5.09*  CALCIUM 8.7*  8.7*  8.6* 8.8* 8.5* 8.9  PHOS 12.6* 10.2*  --  9.5*    Liver Function Tests: Recent Labs  Lab 01/27/18 0750 01/29/18 0724 02/01/18 0657  ALBUMIN 2.3* 2.5* 2.6*   No results for input(s): LIPASE, AMYLASE in the last 168 hours. No results for input(s): AMMONIA in the last 168 hours.  CBC: Recent Labs  Lab 01/27/18 0750 01/29/18 0724 01/31/18 0439 01/31/18 0939 02/01/18 0657  WBC 23.8* 23.2* SPECIMEN CLOTTED 23.5* 21.1*  HGB  5.8* 7.3* 7.6* 8.1* 7.2*  HCT 18.1* 22.8* 22.7* 22.5* 22.8*  MCV 96.3 95.4 98.7 99.6 100.0  PLT 237 177 155 184 216    Cardiac Enzymes: No results for input(s): CKTOTAL, CKMB, CKMBINDEX, TROPONINI in the last 168 hours.  BNP (last 3 results) No results for input(s): BNP in the last 8760 hours.  ProBNP (last 3 results) No results for input(s): PROBNP in the last 8760 hours.  Radiological Exams on Admission: No results found.  Assessment/Plan Principal Problem:   Acute on chronic respiratory failure with hypoxia (HCC) Active Problems:   Acute tubular necrosis (HCC)   Lobar pneumonia (HCC)   Severe sepsis with septic shock (HCC)   ARDS (adult respiratory distress syndrome) (HCC)   Acute systolic CHF (congestive heart failure) (HCC)   1. Acute on chronic respiratory failure with hypoxia patient will continue with T collar reassess for PMV again tomorrow we will continue secretion management pulmonary toilet 2. ARDS clinically resolved 3. Lobar pneumonia treated with antibiotics we will continue to follow 4. Acute tubular necrosis followed by nephrology will continue dialysis as needed 5. Severe sepsis with shock clinically resolved 6. Acute systolic heart failure will continue with supportive care   I have personally seen and evaluated the patient, evaluated laboratory and imaging results, formulated the assessment and plan and placed orders. The Patient requires high complexity decision making for assessment and support.  Case was discussed on  Rounds with the Respiratory Therapy Staff  Allyne Gee, MD [facility] Pulmonary Critical Care Medicine Sleep Medicine

## 2018-02-03 DIAGNOSIS — J9621 Acute and chronic respiratory failure with hypoxia: Secondary | ICD-10-CM | POA: Diagnosis not present

## 2018-02-03 DIAGNOSIS — J80 Acute respiratory distress syndrome: Secondary | ICD-10-CM | POA: Diagnosis not present

## 2018-02-03 DIAGNOSIS — I5021 Acute systolic (congestive) heart failure: Secondary | ICD-10-CM | POA: Diagnosis not present

## 2018-02-03 DIAGNOSIS — N170 Acute kidney failure with tubular necrosis: Secondary | ICD-10-CM | POA: Diagnosis not present

## 2018-02-03 LAB — RENAL FUNCTION PANEL
Albumin: 2.5 g/dL — ABNORMAL LOW (ref 3.5–5.0)
Anion gap: 14 (ref 5–15)
BUN: 50 mg/dL — AB (ref 6–20)
CHLORIDE: 97 mmol/L — AB (ref 101–111)
CO2: 25 mmol/L (ref 22–32)
CREATININE: 3.32 mg/dL — AB (ref 0.44–1.00)
Calcium: 8.6 mg/dL — ABNORMAL LOW (ref 8.9–10.3)
GFR calc Af Amer: 16 mL/min — ABNORMAL LOW (ref >60)
GFR, EST NON AFRICAN AMERICAN: 14 mL/min — AB (ref >60)
Glucose, Bld: 154 mg/dL — ABNORMAL HIGH (ref 65–99)
Phosphorus: 4.4 mg/dL (ref 2.5–4.6)
Potassium: 4 mmol/L (ref 3.5–5.1)
SODIUM: 136 mmol/L (ref 135–145)

## 2018-02-03 LAB — CBC
HCT: 21.6 % — ABNORMAL LOW (ref 36.0–46.0)
Hemoglobin: 6.9 g/dL — CL (ref 12.0–15.0)
MCH: 32.1 pg (ref 26.0–34.0)
MCHC: 31.9 g/dL (ref 30.0–36.0)
MCV: 100.5 fL — AB (ref 78.0–100.0)
PLATELETS: 197 10*3/uL (ref 150–400)
RBC: 2.15 MIL/uL — ABNORMAL LOW (ref 3.87–5.11)
RDW: 22.4 % — ABNORMAL HIGH (ref 11.5–15.5)
WBC: 21.5 10*3/uL — AB (ref 4.0–10.5)

## 2018-02-03 LAB — PREPARE RBC (CROSSMATCH)

## 2018-02-03 NOTE — Progress Notes (Signed)
Pulmonary Critical Care Medicine [facility] [place]   PULMONARY SERVICE  PROGRESS NOTE  Date of Service: 02/03/2018  Terri Ellis  ZOX:[id number]  DOB: October 27, 1955   DOA: 01/24/2018  Referring Physician: Carron Curie, MD  HPI: Terri Ellis is a 62 y.o. female seen for follow up of Acute on Chronic Respiratory Failure.  She is on T collar and has been on 3540% tolerating PMV also  Medications: Reviewed on Rounds  Physical Exam:  Vitals: Temperature 97.6 pulse 100 respiratory rate 24 blood pressure 118/55 saturations 95%  Ventilator Settings patient is off of the ventilator on T collar  . General: Comfortable at this time . Eyes: Grossly normal lids, irises & conjunctiva . ENT: grossly tongue is normal . Neck: no obvious mass . Cardiovascular: S1-S2 normal no gallop or rub . Respiratory: Good air entry bilaterally . Abdomen: Soft and nontender . Skin: no rash seen on limited exam . Musculoskeletal: not rigid . Psychiatric:unable to assess . Neurologic: no seizure no involuntary movements         Labs on Admission:  Basic Metabolic Panel: Recent Labs  Lab 01/29/18 0724 01/31/18 0439 02/01/18 0657 02/03/18 0720  NA 132* 132* 136 136  K 4.4 5.7* 4.7 4.0  CL 92* 96* 98* 97*  CO2 20* 17* 22 25  GLUCOSE 153* 159* 134* 154*  BUN 121* 71* 92* 50*  CREATININE 4.76* 3.98* 5.09* 3.32*  CALCIUM 8.8* 8.5* 8.9 8.6*  PHOS 10.2*  --  9.5* 4.4    Liver Function Tests: Recent Labs  Lab 01/29/18 0724 02/01/18 0657 02/03/18 0720  ALBUMIN 2.5* 2.6* 2.5*   No results for input(s): LIPASE, AMYLASE in the last 168 hours. No results for input(s): AMMONIA in the last 168 hours.  CBC: Recent Labs  Lab 01/29/18 0724 01/31/18 0439 01/31/18 0939 02/01/18 0657 02/03/18 0720  WBC 23.2* SPECIMEN CLOTTED 23.5* 21.1* 21.5*  HGB 7.3* 7.6* 8.1* 7.2* 6.9*  HCT 22.8* 22.7* 22.5* 22.8* 21.6*  MCV 95.4 98.7 99.6 100.0 100.5*  PLT 177 155 184 216 197     Cardiac Enzymes: No results for input(s): CKTOTAL, CKMB, CKMBINDEX, TROPONINI in the last 168 hours.  BNP (last 3 results) No results for input(s): BNP in the last 8760 hours.  ProBNP (last 3 results) No results for input(s): PROBNP in the last 8760 hours.  Radiological Exams on Admission: Dg Chest Port 1 View  Result Date: 02/02/2018 CLINICAL DATA:  62 year old with elevated white blood cell count. EXAM: PORTABLE CHEST 1 VIEW COMPARISON:  01/28/2018 FINDINGS: Again noted is a right jugular dialysis catheter with the tip near the superior cavoatrial junction. There is a right arm PICC line with the tip in the upper SVC region. Hazy interstitial densities in both lungs are suggestive for pulmonary edema and similar to the previous examination. Lung bases are incompletely imaged. Heart size appears to be upper limits of normal. Patient has a tracheostomy tube. IMPRESSION: Bilateral interstitial lung densities. Findings are suggestive for pulmonary edema and incomplete evaluation of the lung bases. Pleural effusions cannot be excluded. Central lines as described. Electronically Signed   By: Richarda Overlie M.D.   On: 02/02/2018 20:38    Assessment/Plan Principal Problem:   Acute on chronic respiratory failure with hypoxia (HCC) Active Problems:   Acute tubular necrosis (HCC)   Lobar pneumonia (HCC)   Severe sepsis with septic shock (HCC)   ARDS (adult respiratory distress syndrome) (HCC)   Acute systolic CHF (congestive heart failure) (HCC)  1. Acute on chronic respiratory failure with hypoxia we will continue with T collar trials can continue with secretion management pulmonary toilet and follow along 2. Acute tubular necrosis followed by nephrology for dialysis will continue supportive care 3. ARDS at baseline now 4. Lobar pneumonia treated with antibiotics we will continue supportive care 5. Acute systolic heart failure needs close fluid management as already indicated above 6. Severe  sepsis with shock hemodynamically stable   I have personally seen and evaluated the patient, evaluated laboratory and imaging results, formulated the assessment and plan and placed orders. The Patient requires high complexity decision making for assessment and support.  Case was discussed on Rounds with the Respiratory Therapy Staff  Yevonne Pax, MD [facility] Pulmonary Critical Care Medicine Sleep Medicine

## 2018-02-04 DIAGNOSIS — J9621 Acute and chronic respiratory failure with hypoxia: Secondary | ICD-10-CM | POA: Diagnosis not present

## 2018-02-04 DIAGNOSIS — I5021 Acute systolic (congestive) heart failure: Secondary | ICD-10-CM | POA: Diagnosis not present

## 2018-02-04 DIAGNOSIS — A419 Sepsis, unspecified organism: Secondary | ICD-10-CM | POA: Diagnosis not present

## 2018-02-04 DIAGNOSIS — J181 Lobar pneumonia, unspecified organism: Secondary | ICD-10-CM | POA: Diagnosis not present

## 2018-02-04 DIAGNOSIS — R6521 Severe sepsis with septic shock: Secondary | ICD-10-CM | POA: Diagnosis not present

## 2018-02-04 DIAGNOSIS — N170 Acute kidney failure with tubular necrosis: Secondary | ICD-10-CM | POA: Diagnosis not present

## 2018-02-04 DIAGNOSIS — J80 Acute respiratory distress syndrome: Secondary | ICD-10-CM | POA: Diagnosis not present

## 2018-02-04 LAB — BPAM RBC
Blood Product Expiration Date: 201906032359
ISSUE DATE / TIME: 201905091637
Unit Type and Rh: 5100

## 2018-02-04 LAB — TYPE AND SCREEN
ABO/RH(D): O POS
Antibody Screen: NEGATIVE
UNIT DIVISION: 0

## 2018-02-04 NOTE — Progress Notes (Signed)
Pulmonary Critical Care Medicine [facility] [place]   PULMONARY SERVICE  PROGRESS NOTE  Date of Service: 02/04/2018  Terri Ellis  NWG:[id number]  DOB: 1955-11-05   DOA: 01/24/2018  Referring Physician: Carron Curie, MD  HPI: Terri Ellis is a 62 y.o. female seen for follow up of Acute on Chronic Respiratory Failure.  Currently on T collar has been having copious amounts of secretions.  Remains on 40% oxygen.  She is at baseline because of the secretions  Medications: Reviewed on Rounds  Physical Exam:  Vitals: Temperature 98.0 pulse 97 respiratory rate 35 blood pressure 135/62 saturations 98%  Ventilator Settings on T collar trials will continue with full supportive care  . General: Comfortable at this time . Eyes: Grossly normal lids, irises & conjunctiva . ENT: grossly tongue is normal . Neck: no obvious mass . Cardiovascular: S1-S2 normal no gallop or rub . Respiratory: No rhonchi expansion is equal . Abdomen: Soft nontender . Skin: no rash seen on limited exam . Musculoskeletal: not rigid . Psychiatric:unable to assess . Neurologic: no seizure no involuntary movements         Labs on Admission:  Basic Metabolic Panel: Recent Labs  Lab 01/29/18 0724 01/31/18 0439 02/01/18 0657 02/03/18 0720  NA 132* 132* 136 136  K 4.4 5.7* 4.7 4.0  CL 92* 96* 98* 97*  CO2 20* 17* 22 25  GLUCOSE 153* 159* 134* 154*  BUN 121* 71* 92* 50*  CREATININE 4.76* 3.98* 5.09* 3.32*  CALCIUM 8.8* 8.5* 8.9 8.6*  PHOS 10.2*  --  9.5* 4.4    Liver Function Tests: Recent Labs  Lab 01/29/18 0724 02/01/18 0657 02/03/18 0720  ALBUMIN 2.5* 2.6* 2.5*   No results for input(s): LIPASE, AMYLASE in the last 168 hours. No results for input(s): AMMONIA in the last 168 hours.  CBC: Recent Labs  Lab 01/29/18 0724 01/31/18 0439 01/31/18 0939 02/01/18 0657 02/03/18 0720  WBC 23.2* SPECIMEN CLOTTED 23.5* 21.1* 21.5*  HGB 7.3* 7.6* 8.1* 7.2* 6.9*  HCT  22.8* 22.7* 22.5* 22.8* 21.6*  MCV 95.4 98.7 99.6 100.0 100.5*  PLT 177 155 184 216 197    Cardiac Enzymes: No results for input(s): CKTOTAL, CKMB, CKMBINDEX, TROPONINI in the last 168 hours.  BNP (last 3 results) No results for input(s): BNP in the last 8760 hours.  ProBNP (last 3 results) No results for input(s): PROBNP in the last 8760 hours.  Radiological Exams on Admission: Dg Chest Port 1 View  Result Date: 02/02/2018 CLINICAL DATA:  62 year old with elevated white blood cell count. EXAM: PORTABLE CHEST 1 VIEW COMPARISON:  01/28/2018 FINDINGS: Again noted is a right jugular dialysis catheter with the tip near the superior cavoatrial junction. There is a right arm PICC line with the tip in the upper SVC region. Hazy interstitial densities in both lungs are suggestive for pulmonary edema and similar to the previous examination. Lung bases are incompletely imaged. Heart size appears to be upper limits of normal. Patient has a tracheostomy tube. IMPRESSION: Bilateral interstitial lung densities. Findings are suggestive for pulmonary edema and incomplete evaluation of the lung bases. Pleural effusions cannot be excluded. Central lines as described. Electronically Signed   By: Richarda Overlie M.D.   On: 02/02/2018 20:38    Assessment/Plan Principal Problem:   Acute on chronic respiratory failure with hypoxia (HCC) Active Problems:   Acute tubular necrosis (HCC)   Lobar pneumonia (HCC)   Severe sepsis with septic shock (HCC)   ARDS (adult respiratory distress  syndrome) (HCC)   Acute systolic CHF (congestive heart failure) (HCC)   1. Acute on chronic respiratory failure continue with T collar titrate oxygen down patient is on 40% oxygen.  We will continue with pulmonary toilet supportive care 2. Lobar pneumonia treated with antibiotics we will continue supportive care 3. Severe sepsis clinically resolved hemodynamically stable 4. ARDS chest x-ray showing small bilateral densities 5. Acute  systolic heart failure we will continue all diuresis as per nephrology on dialysis   I have personally seen and evaluated the patient, evaluated laboratory and imaging results, formulated the assessment and plan and placed orders. The Patient requires high complexity decision making for assessment and support.  Case was discussed on Rounds with the Respiratory Therapy Staff  Yevonne Pax, MD [facility] Pulmonary Critical Care Medicine Sleep Medicine

## 2018-02-04 NOTE — Progress Notes (Signed)
[facility]  ROUNDING NOTE   Subjective:  Patient currently sitting up in chair.  She will be due for dialysis again tomorrow. Husband at bedside.   Objective:  Vital signs in last 24 hours:  Temperature 98.4 pulse 99 respirations 30 blood pressure 106/68  Physical Exam: General: Critically ill-appearing  Head: Normocephalic, atraumatic. Moist oral mucosal membranes  Eyes: Anicteric  Neck: Tracheostomy in place  Lungs:  Bilateral rhonchi  Heart: S1S2 no rubs  Abdomen:  Soft, nontender, PEG  Extremities: 1+ peripheral edema.  Neurologic: Awake, alert, nods yes/no to questions  Skin: No lesions  Access: Right internal jugular PermCath    Basic Metabolic Panel: Recent Labs  Lab 01/29/18 0724 01/31/18 0439 02/01/18 0657 02/03/18 0720  NA 132* 132* 136 136  K 4.4 5.7* 4.7 4.0  CL 92* 96* 98* 97*  CO2 20* 17* 22 25  GLUCOSE 153* 159* 134* 154*  BUN 121* 71* 92* 50*  CREATININE 4.76* 3.98* 5.09* 3.32*  CALCIUM 8.8* 8.5* 8.9 8.6*  PHOS 10.2*  --  9.5* 4.4    Liver Function Tests: Recent Labs  Lab 01/29/18 0724 02/01/18 0657 02/03/18 0720  ALBUMIN 2.5* 2.6* 2.5*   No results for input(s): LIPASE, AMYLASE in the last 168 hours. No results for input(s): AMMONIA in the last 168 hours.  CBC: Recent Labs  Lab 01/29/18 0724 01/31/18 0439 01/31/18 0939 02/01/18 0657 02/03/18 0720  WBC 23.2* SPECIMEN CLOTTED 23.5* 21.1* 21.5*  HGB 7.3* 7.6* 8.1* 7.2* 6.9*  HCT 22.8* 22.7* 22.5* 22.8* 21.6*  MCV 95.4 98.7 99.6 100.0 100.5*  PLT 177 155 184 216 197    Cardiac Enzymes: No results for input(s): CKTOTAL, CKMB, CKMBINDEX, TROPONINI in the last 168 hours.  BNP: Invalid input(s): POCBNP  CBG: No results for input(s): GLUCAP in the last 168 hours.  Microbiology: Results for orders placed or performed during the hospital encounter of 01/24/18  Culture, Urine     Status: None   Collection Time: 01/28/18  3:00 AM  Result Value Ref Range Status   Specimen Description URINE, RANDOM  Final   Special Requests NONE  Final   Culture   Final    NO GROWTH Performed at [facility], [address], [place], [place] [address]    Report Status 01/29/2018 FINAL  Final    Coagulation Studies: No results for input(s): LABPROT, INR in the last 72 hours.  Urinalysis: No results for input(s): COLORURINE, LABSPEC, PHURINE, GLUCOSEU, HGBUR, BILIRUBINUR, KETONESUR, PROTEINUR, UROBILINOGEN, NITRITE, LEUKOCYTESUR in the last 72 hours.  Invalid input(s): APPERANCEUR    Imaging: Dg Chest Port 1 View  Result Date: 02/02/2018 CLINICAL DATA:  62 year old with elevated white blood cell count. EXAM: PORTABLE CHEST 1 VIEW COMPARISON:  01/28/2018 FINDINGS: Again noted is a right jugular dialysis catheter with the tip near the superior cavoatrial junction. There is a right arm PICC line with the tip in the upper SVC region. Hazy interstitial densities in both lungs are suggestive for pulmonary edema and similar to the previous examination. Lung bases are incompletely imaged. Heart size appears to be upper limits of normal. Patient has a tracheostomy tube. IMPRESSION: Bilateral interstitial lung densities. Findings are suggestive for pulmonary edema and incomplete evaluation of the lung bases. Pleural effusions cannot be excluded. Central lines as described. Electronically Signed   By: Richarda Overlie M.D.   On: 02/02/2018 20:38     Medications:       Assessment/ Plan:  62 y.o. female with a  PMHx of congestive heart failure, chronic kidney disease stage III, acute respiratory failure status post tracheostomy placement, hypertension, iron deficiency anemia, diabetes mellitus type 2, hyperlipidemia, obesity, who was admitted to [facility] on 01/24/2018 for ongoing treatment of pneumonia, acute respiratory failure, acute renal failure requiring hemodialysis.  1.  Acute renal fluid/chronic kidney disease stage III baseline creatinine 1.8.  Patient was  seen by nephrology at the outside hospital.  She was initially started on CRRT and subsequently transitioned to intermittent hemodialysis. -Patient will be due for dialysis again tomorrow.  Orders to be prepared.  2.  Acute respiratory failure.  Patient is currently on aerosolized trach collar.  Appears to be tolerating this well.  3.  Anemia of chronic kidney disease.  Hemoglobin currently 6.9.  Blood transfusion was ordered.  4.  Secondary hyperparathyroidism.  Serum phosphorus is now normalized at 4.4.  Repeat this tomorrow.  5.  Hyperkalemia.  Resolved with dialysis.  Ium down to 4.7 today.  Repeat values tomorrow.      LOS: 0 Gayna Braddy 5/10/20195:07 PM

## 2018-02-05 DIAGNOSIS — J9621 Acute and chronic respiratory failure with hypoxia: Secondary | ICD-10-CM | POA: Diagnosis not present

## 2018-02-05 DIAGNOSIS — I5021 Acute systolic (congestive) heart failure: Secondary | ICD-10-CM | POA: Diagnosis not present

## 2018-02-05 DIAGNOSIS — J80 Acute respiratory distress syndrome: Secondary | ICD-10-CM | POA: Diagnosis not present

## 2018-02-05 DIAGNOSIS — N170 Acute kidney failure with tubular necrosis: Secondary | ICD-10-CM | POA: Diagnosis not present

## 2018-02-05 LAB — CBC
HCT: 25.7 % — ABNORMAL LOW (ref 36.0–46.0)
HEMOGLOBIN: 7.9 g/dL — AB (ref 12.0–15.0)
MCH: 30.4 pg (ref 26.0–34.0)
MCHC: 30.7 g/dL (ref 30.0–36.0)
MCV: 98.8 fL (ref 78.0–100.0)
PLATELETS: 184 10*3/uL (ref 150–400)
RBC: 2.6 MIL/uL — AB (ref 3.87–5.11)
RDW: 22.9 % — ABNORMAL HIGH (ref 11.5–15.5)
WBC: 17 10*3/uL — AB (ref 4.0–10.5)

## 2018-02-05 LAB — RENAL FUNCTION PANEL
Albumin: 2.4 g/dL — ABNORMAL LOW (ref 3.5–5.0)
Anion gap: 13 (ref 5–15)
BUN: 52 mg/dL — ABNORMAL HIGH (ref 6–20)
CALCIUM: 8.7 mg/dL — AB (ref 8.9–10.3)
CO2: 25 mmol/L (ref 22–32)
CREATININE: 3.49 mg/dL — AB (ref 0.44–1.00)
Chloride: 96 mmol/L — ABNORMAL LOW (ref 101–111)
GFR, EST AFRICAN AMERICAN: 15 mL/min — AB (ref >60)
GFR, EST NON AFRICAN AMERICAN: 13 mL/min — AB (ref >60)
Glucose, Bld: 147 mg/dL — ABNORMAL HIGH (ref 65–99)
Phosphorus: 4.8 mg/dL — ABNORMAL HIGH (ref 2.5–4.6)
Potassium: 4.5 mmol/L (ref 3.5–5.1)
Sodium: 134 mmol/L — ABNORMAL LOW (ref 135–145)

## 2018-02-05 NOTE — Progress Notes (Signed)
Pulmonary Critical Care Medicine [facility] [place]   PULMONARY SERVICE  PROGRESS NOTE  Date of Service: 02/05/2018  Terri Ellis  NWG:[id number]  DOB: 1956-02-17   DOA: 01/24/2018  Referring Physician: Carron Curie, MD  HPI: Terri Ellis is a 62 y.o. female seen for follow up of Acute on Chronic Respiratory Failure.  Patient is on T collar right now has been on 40% oxygen.  Doing well no desaturations are noted  Medications: Reviewed on Rounds  Physical Exam:  Vitals: Temperature 97.4 pulse 67 respiratory rate 24 blood pressure 118/60 saturations 96%  Ventilator Settings aerosolized T collar FiO2 40%  . General: Comfortable at this time . Eyes: Grossly normal lids, irises & conjunctiva . ENT: grossly tongue is normal . Neck: no obvious mass . Cardiovascular: S1-S2 normal no gallop or rub . Respiratory: No rhonchi expansion is equal . Abdomen: Soft nontender . Skin: no rash seen on limited exam . Musculoskeletal: not rigid . Psychiatric:unable to assess . Neurologic: no seizure no involuntary movements         Labs on Admission:  Basic Metabolic Panel: Recent Labs  Lab 01/31/18 0439 02/01/18 0657 02/03/18 0720 02/05/18 0654  NA 132* 136 136 134*  K 5.7* 4.7 4.0 4.5  CL 96* 98* 97* 96*  CO2 17* GLUCOSE 159* 134* 154* 147*  BUN 71* 92* 50* 52*  CREATININE 3.98* 5.09* 3.32* 3.49*  CALCIUM 8.5* 8.9 8.6* 8.7*  PHOS  --  9.5* 4.4 4.8*    Liver Function Tests: Recent Labs  Lab 02/01/18 0657 02/03/18 0720 02/05/18 0654  ALBUMIN 2.6* 2.5* 2.4*   No results for input(s): LIPASE, AMYLASE in the last 168 hours. No results for input(s): AMMONIA in the last 168 hours.  CBC: Recent Labs  Lab 01/31/18 0439 01/31/18 0939 02/01/18 0657 02/03/18 0720 02/05/18 0654  WBC SPECIMEN CLOTTED 23.5* 21.1* 21.5* 17.0*  HGB 7.6* 8.1* 7.2* 6.9* 7.9*  HCT 22.7* 22.5* 22.8* 21.6* 25.7*  MCV 98.7 99.6 100.0 100.5* 98.8  PLT 155  184 216 197 184    Cardiac Enzymes: No results for input(s): CKTOTAL, CKMB, CKMBINDEX, TROPONINI in the last 168 hours.  BNP (last 3 results) No results for input(s): BNP in the last 8760 hours.  ProBNP (last 3 results) No results for input(s): PROBNP in the last 8760 hours.  Radiological Exams on Admission: Dg Chest Port 1 View  Result Date: 02/02/2018 CLINICAL DATA:  62 year old with elevated white blood cell count. EXAM: PORTABLE CHEST 1 VIEW COMPARISON:  01/28/2018 FINDINGS: Again noted is a right jugular dialysis catheter with the tip near the superior cavoatrial junction. There is a right arm PICC line with the tip in the upper SVC region. Hazy interstitial densities in both lungs are suggestive for pulmonary edema and similar to the previous examination. Lung bases are incompletely imaged. Heart size appears to be upper limits of normal. Patient has a tracheostomy tube. IMPRESSION: Bilateral interstitial lung densities. Findings are suggestive for pulmonary edema and incomplete evaluation of the lung bases. Pleural effusions cannot be excluded. Central lines as described. Electronically Signed   By: Richarda Overlie M.D.   On: 02/02/2018 20:38    Assessment/Plan Principal Problem:   Acute on chronic respiratory failure with hypoxia (HCC) Active Problems:   Acute tubular necrosis (HCC)   Lobar pneumonia (HCC)   Severe sepsis with septic shock (HCC)   ARDS (adult respiratory distress syndrome) (HCC)   Acute systolic CHF (congestive heart failure) (HCC)  1. Acute on chronic respiratory failure with hypoxia patient continues to wean on T collar will change the tracheostomy out today to a cuff less trach and try to use PMV 2. Acute tubular necrosis followed by nephrology has been tolerating dialysis 3. Lobar pneumonia treated with antibiotics 4. ARDS clinically improving radiologically still with infiltrates on the last chest x-ray 5. Acute on chronic systolic heart failure we will  continue with supportive care pulmonary toilet 6. Severe sepsis and shock continue present management hemodynamically stable   I have personally seen and evaluated the patient, evaluated laboratory and imaging results, formulated the assessment and plan and placed orders. The Patient requires high complexity decision making for assessment and support.  Case was discussed on Rounds with the Respiratory Therapy Staff  Yevonne Pax, MD [facility] Pulmonary Critical Care Medicine Sleep Medicine

## 2018-02-06 DIAGNOSIS — N170 Acute kidney failure with tubular necrosis: Secondary | ICD-10-CM | POA: Diagnosis not present

## 2018-02-06 DIAGNOSIS — I5021 Acute systolic (congestive) heart failure: Secondary | ICD-10-CM | POA: Diagnosis not present

## 2018-02-06 DIAGNOSIS — J9621 Acute and chronic respiratory failure with hypoxia: Secondary | ICD-10-CM | POA: Diagnosis not present

## 2018-02-06 DIAGNOSIS — J80 Acute respiratory distress syndrome: Secondary | ICD-10-CM | POA: Diagnosis not present

## 2018-02-06 LAB — COMPREHENSIVE METABOLIC PANEL
ALBUMIN: 2.3 g/dL — AB (ref 3.5–5.0)
ALK PHOS: 777 U/L — AB (ref 38–126)
ALT: 276 U/L — ABNORMAL HIGH (ref 14–54)
ANION GAP: 12 (ref 5–15)
AST: 278 U/L — ABNORMAL HIGH (ref 15–41)
BUN: 48 mg/dL — ABNORMAL HIGH (ref 6–20)
CO2: 24 mmol/L (ref 22–32)
Calcium: 8.9 mg/dL (ref 8.9–10.3)
Chloride: 99 mmol/L — ABNORMAL LOW (ref 101–111)
Creatinine, Ser: 3.03 mg/dL — ABNORMAL HIGH (ref 0.44–1.00)
GFR calc Af Amer: 18 mL/min — ABNORMAL LOW (ref >60)
GFR calc non Af Amer: 15 mL/min — ABNORMAL LOW (ref >60)
GLUCOSE: 231 mg/dL — AB (ref 65–99)
POTASSIUM: 4 mmol/L (ref 3.5–5.1)
SODIUM: 135 mmol/L (ref 135–145)
Total Bilirubin: 7.1 mg/dL — ABNORMAL HIGH (ref 0.3–1.2)
Total Protein: 6.8 g/dL (ref 6.5–8.1)

## 2018-02-06 NOTE — Progress Notes (Signed)
Pulmonary Critical Care Medicine [facility] [place]   PULMONARY SERVICE  PROGRESS NOTE  Date of Service: 02/06/2018  Terri Ellis  ZOX:[id number]  DOB: 09-07-1956   DOA: 01/24/2018  Referring Physician: Carron Curie, MD  HPI: Terri Ellis is a 62 y.o. female seen for follow up of Acute on Chronic Respiratory Failure.  She is on T collar doing fairly well right now is on 40% oxygen has been able to tolerate the PMV she was already downsized to a #6 trach  Medications: Reviewed on Rounds  Physical Exam:  Vitals: Temperature 97.6 pulse 99 respiratory rate 12 blood pressure 99/63 saturations 98%  Ventilator Settings off of the ventilator at this time on T collar  . General: Comfortable at this time . Eyes: Grossly normal lids, irises & conjunctiva . ENT: grossly tongue is normal . Neck: no obvious mass . Cardiovascular: S1-S2 normal no gallop or rub . Respiratory: No rhonchi . Abdomen: Soft nontender . Skin: no rash seen on limited exam . Musculoskeletal: not rigid . Psychiatric:unable to assess . Neurologic: no seizure no involuntary movements         Labs on Admission:  Basic Metabolic Panel: Recent Labs  Lab 01/31/18 0439 02/01/18 0657 02/03/18 0720 02/05/18 0654 02/06/18 1442  NA 132* 136 136 134* 135  K 5.7* 4.7 4.0 4.5 4.0  CL 96* 98* 97* 96* 99*  CO2 17* GLUCOSE 159* 134* 154* 147* 231*  BUN 71* 92* 50* 52* 48*  CREATININE 3.98* 5.09* 3.32* 3.49* 3.03*  CALCIUM 8.5* 8.9 8.6* 8.7* 8.9  PHOS  --  9.5* 4.4 4.8*  --     Liver Function Tests: Recent Labs  Lab 02/01/18 0657 02/03/18 0720 02/05/18 0654 02/06/18 1442  AST  --   --   --  278*  ALT  --   --   --  276*  ALKPHOS  --   --   --  777*  BILITOT  --   --   --  7.1*  PROT  --   --   --  6.8  ALBUMIN 2.6* 2.5* 2.4* 2.3*   No results for input(s): LIPASE, AMYLASE in the last 168 hours. No results for input(s): AMMONIA in the last 168  hours.  CBC: Recent Labs  Lab 01/31/18 0439 01/31/18 0939 02/01/18 0657 02/03/18 0720 02/05/18 0654  WBC SPECIMEN CLOTTED 23.5* 21.1* 21.5* 17.0*  HGB 7.6* 8.1* 7.2* 6.9* 7.9*  HCT 22.7* 22.5* 22.8* 21.6* 25.7*  MCV 98.7 99.6 100.0 100.5* 98.8  PLT 155 184 216 197 184    Cardiac Enzymes: No results for input(s): CKTOTAL, CKMB, CKMBINDEX, TROPONINI in the last 168 hours.  BNP (last 3 results) No results for input(s): BNP in the last 8760 hours.  ProBNP (last 3 results) No results for input(s): PROBNP in the last 8760 hours.  Radiological Exams on Admission: Dg Chest Port 1 View  Result Date: 02/02/2018 CLINICAL DATA:  62 year old with elevated white blood cell count. EXAM: PORTABLE CHEST 1 VIEW COMPARISON:  01/28/2018 FINDINGS: Again noted is a right jugular dialysis catheter with the tip near the superior cavoatrial junction. There is a right arm PICC line with the tip in the upper SVC region. Hazy interstitial densities in both lungs are suggestive for pulmonary edema and similar to the previous examination. Lung bases are incompletely imaged. Heart size appears to be upper limits of normal. Patient has a tracheostomy tube. IMPRESSION: Bilateral interstitial lung densities. Findings are  suggestive for pulmonary edema and incomplete evaluation of the lung bases. Pleural effusions cannot be excluded. Central lines as described. Electronically Signed   By: Adam  Henn M.D.   ORicharda Overlie8/2019 20:38    Assessment/Plan Principal Problem:   Acute on chronic respiratory failure with hypoxia (HCC) Active Problems:   Acute tubular necrosis (HCC)   Lobar pneumonia (HCC)   Severe sepsis with septic shock (HCC)   ARDS (adult respiratory distress syndrome) (HCC)   Acute systolic CHF (congestive heart failure) (HCC)   1. Acute on chronic respiratory failure with hypoxia we will continue with weaning on T collar as ordered continue with aggressive pulmonary toilet supportive care 2. Lobar  pneumonia treated with antibiotics we will continue to monitor 3. Acute tubular necrosis followed by nephrology dialyze as needed 4. ARDS clinically improving with still some densities noted on the x-rays 5. Acute systolic heart failure we will keep patient on the dry side and monitor fluid status very closely   I have personally seen and evaluated the patient, evaluated laboratory and imaging results, formulated the assessment and plan and placed orders. The Patient requires high complexity decision making for assessment and support.  Case was discussed on Rounds with the Respiratory Therapy Staff  Yevonne Pax, MD [facility] Pulmonary Critical Care Medicine Sleep Medicine

## 2018-02-07 ENCOUNTER — Other Ambulatory Visit (HOSPITAL_COMMUNITY): Payer: Self-pay

## 2018-02-07 DIAGNOSIS — J80 Acute respiratory distress syndrome: Secondary | ICD-10-CM | POA: Diagnosis not present

## 2018-02-07 DIAGNOSIS — I5021 Acute systolic (congestive) heart failure: Secondary | ICD-10-CM | POA: Diagnosis not present

## 2018-02-07 DIAGNOSIS — J9621 Acute and chronic respiratory failure with hypoxia: Secondary | ICD-10-CM | POA: Diagnosis not present

## 2018-02-07 DIAGNOSIS — N170 Acute kidney failure with tubular necrosis: Secondary | ICD-10-CM | POA: Diagnosis not present

## 2018-02-07 LAB — COMPREHENSIVE METABOLIC PANEL
ALBUMIN: 2.5 g/dL — AB (ref 3.5–5.0)
ALK PHOS: 733 U/L — AB (ref 38–126)
ALT: 238 U/L — AB (ref 14–54)
AST: 180 U/L — AB (ref 15–41)
Anion gap: 16 — ABNORMAL HIGH (ref 5–15)
BILIRUBIN TOTAL: 7.1 mg/dL — AB (ref 0.3–1.2)
BUN: 65 mg/dL — AB (ref 6–20)
CALCIUM: 9.2 mg/dL (ref 8.9–10.3)
CO2: 23 mmol/L (ref 22–32)
Chloride: 100 mmol/L — ABNORMAL LOW (ref 101–111)
Creatinine, Ser: 3.73 mg/dL — ABNORMAL HIGH (ref 0.44–1.00)
GFR calc Af Amer: 14 mL/min — ABNORMAL LOW (ref >60)
GFR calc non Af Amer: 12 mL/min — ABNORMAL LOW (ref >60)
GLUCOSE: 106 mg/dL — AB (ref 65–99)
Potassium: 3.4 mmol/L — ABNORMAL LOW (ref 3.5–5.1)
Sodium: 139 mmol/L (ref 135–145)
TOTAL PROTEIN: 6.4 g/dL — AB (ref 6.5–8.1)

## 2018-02-07 LAB — AMMONIA: Ammonia: 98 umol/L — ABNORMAL HIGH (ref 9–35)

## 2018-02-07 NOTE — Progress Notes (Signed)
[facility]  ROUNDING NOTE   Subjective:  Patient resting comfortably in bed.  She will be due for dialysis again tomorrow. Overall has made progress from admission.   Objective:  Vital signs in last 24 hours:  Temperature 97.9 pulse 94 respirations 24 blood pressure 127/73  Physical Exam: General: Critically ill-appearing  Head: Normocephalic, atraumatic. Moist oral mucosal membranes  Eyes: Anicteric  Neck: Tracheostomy in place  Lungs:  Bilateral rhonchi  Heart: S1S2 no rubs  Abdomen:  Soft, nontender, PEG  Extremities: 1+ peripheral edema.  Neurologic: Awake, alert, follows simple commands  Skin: No lesions  Access: Right internal jugular PermCath    Basic Metabolic Panel: Recent Labs  Lab 02/01/18 0657 02/03/18 0720 02/05/18 0654 02/06/18 1442  NA 136 136 134* 135  K 4.7 4.0 4.5 4.0  CL 98* 97* 96* 99*  CO2 GLUCOSE 134* 154* 147* 231*  BUN 92* 50* 52* 48*  CREATININE 5.09* 3.32* 3.49* 3.03*  CALCIUM 8.9 8.6* 8.7* 8.9  PHOS 9.5* 4.4 4.8*  --     Liver Function Tests: Recent Labs  Lab 02/01/18 0657 02/03/18 0720 02/05/18 0654 02/06/18 1442  AST  --   --   --  278*  ALT  --   --   --  276*  ALKPHOS  --   --   --  777*  BILITOT  --   --   --  7.1*  PROT  --   --   --  6.8  ALBUMIN 2.6* 2.5* 2.4* 2.3*   No results for input(s): LIPASE, AMYLASE in the last 168 hours. No results for input(s): AMMONIA in the last 168 hours.  CBC: Recent Labs  Lab 01/31/18 0939 02/01/18 0657 02/03/18 0720 02/05/18 0654  WBC 23.5* 21.1* 21.5* 17.0*  HGB 8.1* 7.2* 6.9* 7.9*  HCT 22.5* 22.8* 21.6* 25.7*  MCV 99.6 100.0 100.5* 98.8  PLT 184 216 197 184    Cardiac Enzymes: No results for input(s): CKTOTAL, CKMB, CKMBINDEX, TROPONINI in the last 168 hours.  BNP: Invalid input(s): POCBNP  CBG: No results for input(s): GLUCAP in the last 168 hours.  Microbiology: Results for orders placed or performed during the hospital encounter of  01/24/18  Culture, Urine     Status: None   Collection Time: 01/28/18  3:00 AM  Result Value Ref Range Status   Specimen Description URINE, RANDOM  Final   Special Requests NONE  Final   Culture   Final    NO GROWTH Performed at [facility], [address] [place], [place] [address]    Report Status 01/29/2018 FINAL  Final    Coagulation Studies: No results for input(s): LABPROT, INR in the last 72 hours.  Urinalysis: No results for input(s): COLORURINE, LABSPEC, PHURINE, GLUCOSEU, HGBUR, BILIRUBINUR, KETONESUR, PROTEINUR, UROBILINOGEN, NITRITE, LEUKOCYTESUR in the last 72 hours.  Invalid input(s): APPERANCEUR    Imaging: US Abdomen Limited Ruq  Result Date: 02/07/2018 CLINICAL DATA:  Elevated liver function tests. History of PEG tube, respiratory failure, end-stage renal disease on dialysis. EXAM: ULTRASOUND ABDOMEN LIMITED RIGHT UPPER QUADRANT COMPARISON:  None. FINDINGS: Gallbladder: Dependent echogenic sludge. Mild gallbladder wall thickening at 3 mm. No pericholecystic fluid. No sonographic Murphy's sign elicited. Common bile duct: Diameter: 5 mm. Liver: No focal lesion identified. Within normal limits in parenchymal echogenicity. Portal vein is patent on color Doppler imaging with normal direction of blood flow towards the liver. Small RIGHT pleural effusion. IMPRESSION: 1. Gallbladder sludge  and mild gallbladder wall thickening without additional findings of acute cholecystitis. 2. Small RIGHT pleural effusion. Electronically Signed   By: Awilda Metro M.D.   On: 02/07/2018 04:28     Medications:       Assessment/ Plan:  62 y.o. female with a PMHx of congestive heart failure, chronic kidney disease stage III, acute respiratory failure status post tracheostomy placement, hypertension, iron deficiency anemia, diabetes mellitus type 2, hyperlipidemia, obesity, who was admitted to [facility] on 01/24/2018 for ongoing treatment of pneumonia, acute respiratory  failure, acute renal failure requiring hemodialysis.  1.  Acute renal failure/chronic kidney disease stage III baseline creatinine 1.8.  Patient was seen by nephrology at the outside hospital.  She was initially started on CRRT and subsequently transitioned to intermittent hemodialysis. -Patient will be due for dialysis again on Tuesday.  We will prepare orders.  2.  Acute respiratory failure.  Doing well off of the ventilator.  Breathing comfortably at the moment.  3.  Anemia of chronic kidney disease.  Hemoglobin up to 7.9 posttransfusion.  Continue to monitor CBC.  4.  Secondary hyperparathyroidism.  Serum phosphorus 4.8 at last check.  Repeat serum phosphorus tomorrow.  5.  Hyperkalemia.  Most recent potassium was 4.0 and within target range.     LOS: 0 Javarious Elsayed 5/13/20198:30 AM

## 2018-02-07 NOTE — Progress Notes (Signed)
Pulmonary Critical Care Medicine [facility] [place]   PULMONARY SERVICE  PROGRESS NOTE  Date of Service: 02/07/2018  Terri Ellis  ZOX:[id number]  DOB: 1956-09-27   DOA: 01/24/2018  Referring Physician: Carron Curie, MD  HPI: Terri Ellis is a 62 y.o. female seen for follow up of Acute on Chronic Respiratory Failure.  Patient is on T collar currently is on 50% oxygen has been doing fine with the PMV also  Medications: Reviewed on Rounds  Physical Exam:  Vitals: Temperature 97.9 pulse 94 respiratory 24 blood pressure 127/73 saturations 100%  Ventilator Settings currently on T collar FiO2 50%  . General: Comfortable at this time . Eyes: Grossly normal lids, irises & conjunctiva . ENT: grossly tongue is normal . Neck: no obvious mass . Cardiovascular: S1-S2 normal no gallop or rub . Respiratory: No rhonchi expansion is equal . Abdomen: Soft nontender . Skin: no rash seen on limited exam . Musculoskeletal: not rigid . Psychiatric:unable to assess . Neurologic: no seizure no involuntary movements         Labs on Admission:  Basic Metabolic Panel: Recent Labs  Lab 02/01/18 0657 02/03/18 0720 02/05/18 0654 02/06/18 1442 02/07/18 0705  NA 136 136 134* 135 139  K 4.7 4.0 4.5 4.0 3.4*  CL 98* 97* 96* 99* 100*  CO2 GLUCOSE 134* 154* 147* 231* 106*  BUN 92* 50* 52* 48* 65*  CREATININE 5.09* 3.32* 3.49* 3.03* 3.73*  CALCIUM 8.9 8.6* 8.7* 8.9 9.2  PHOS 9.5* 4.4 4.8*  --   --     Liver Function Tests: Recent Labs  Lab 02/01/18 0657 02/03/18 0720 02/05/18 0654 02/06/18 1442 02/07/18 0705  AST  --   --   --  278* 180*  ALT  --   --   --  276* 238*  ALKPHOS  --   --   --  777* 733*  BILITOT  --   --   --  7.1* 7.1*  PROT  --   --   --  6.8 6.4*  ALBUMIN 2.6* 2.5* 2.4* 2.3* 2.5*   No results for input(s): LIPASE, AMYLASE in the last 168 hours. Recent Labs  Lab 02/07/18 0705  AMMONIA 98*    CBC: Recent Labs   Lab 02/01/18 0657 02/03/18 0720 02/05/18 0654  WBC 21.1* 21.5* 17.0*  HGB 7.2* 6.9* 7.9*  HCT 22.8* 21.6* 25.7*  MCV 100.0 100.5* 98.8  PLT 216 197 184    Cardiac Enzymes: No results for input(s): CKTOTAL, CKMB, CKMBINDEX, TROPONINI in the last 168 hours.  BNP (last 3 results) No results for input(s): BNP in the last 8760 hours.  ProBNP (last 3 results) No results for input(s): PROBNP in the last 8760 hours.  Radiological Exams on Admission: US Abdomen Limited Ruq  Result Date: 02/07/2018 CLINICAL DATA:  Elevated liver function tests. History of PEG tube, respiratory failure, end-stage renal disease on dialysis. EXAM: ULTRASOUND ABDOMEN LIMITED RIGHT UPPER QUADRANT COMPARISON:  None. FINDINGS: Gallbladder: Dependent echogenic sludge. Mild gallbladder wall thickening at 3 mm. No pericholecystic fluid. No sonographic Murphy's sign elicited. Common bile duct: Diameter: 5 mm. Liver: No focal lesion identified. Within normal limits in parenchymal echogenicity. Portal vein is patent on color Doppler imaging with normal direction of blood flow towards the liver. Small RIGHT pleural effusion. IMPRESSION: 1. Gallbladder sludge and mild gallbladder wall thickening without additional findings of acute cholecystitis. 2. Small RIGHT pleural effusion. Electronically Signed   By: Pernell Dupre  Bloomer M.D.   On: 02/07/2018 04:28    Assessment/Plan Principal Problem:   Acute on chronic respiratory failure with hypoxia (HCC) Active Problems:   Acute tubular necrosis (HCC)   Lobar pneumonia (HCC)   Severe sepsis with septic shock (HCC)   ARDS (adult respiratory distress syndrome) (HCC)   Acute systolic CHF (congestive heart failure) (HCC)   1. Acute on chronic respiratory failure with hypoxia we will continue with weaning on T collar and use the PMV as tolerated titrate oxygen continue pulmonary toilet 2. ARDS clinically improving will follow along 3. Lobar pneumonia treated with  antibiotics 4. Severe sepsis hemodynamically stable will follow 5. Acute systolic heart failure monitor fluid status closely 6. Acute tubular necrosis followed by nephrology for dialysis   I have personally seen and evaluated the patient, evaluated laboratory and imaging results, formulated the assessment and plan and placed orders. The Patient requires high complexity decision making for assessment and support.  Case was discussed on Rounds with the Respiratory Therapy Staff  Yevonne Pax, MD [facility] Pulmonary Critical Care Medicine Sleep Medicine

## 2018-02-08 ENCOUNTER — Other Ambulatory Visit (HOSPITAL_COMMUNITY): Payer: Self-pay

## 2018-02-08 DIAGNOSIS — J9621 Acute and chronic respiratory failure with hypoxia: Secondary | ICD-10-CM | POA: Diagnosis not present

## 2018-02-08 DIAGNOSIS — N170 Acute kidney failure with tubular necrosis: Secondary | ICD-10-CM | POA: Diagnosis not present

## 2018-02-08 DIAGNOSIS — J80 Acute respiratory distress syndrome: Secondary | ICD-10-CM | POA: Diagnosis not present

## 2018-02-08 DIAGNOSIS — I5021 Acute systolic (congestive) heart failure: Secondary | ICD-10-CM | POA: Diagnosis not present

## 2018-02-08 LAB — CBC
HCT: 22.3 % — ABNORMAL LOW (ref 36.0–46.0)
Hemoglobin: 7.1 g/dL — ABNORMAL LOW (ref 12.0–15.0)
MCH: 31.6 pg (ref 26.0–34.0)
MCHC: 31.8 g/dL (ref 30.0–36.0)
MCV: 99.1 fL (ref 78.0–100.0)
Platelets: 178 10*3/uL (ref 150–400)
RBC: 2.25 MIL/uL — ABNORMAL LOW (ref 3.87–5.11)
RDW: 22.7 % — AB (ref 11.5–15.5)
WBC: 17.9 10*3/uL — ABNORMAL HIGH (ref 4.0–10.5)

## 2018-02-08 LAB — HEPATITIS PANEL, ACUTE
HCV Ab: <0.1 s/co ratio (ref 0.0–0.9)
HEP B S AG: NEGATIVE
Hep A IgM: NEGATIVE
Hep B C IgM: NEGATIVE

## 2018-02-08 LAB — RENAL FUNCTION PANEL
ANION GAP: 17 — AB (ref 5–15)
Albumin: 2.6 g/dL — ABNORMAL LOW (ref 3.5–5.0)
BUN: 99 mg/dL — AB (ref 6–20)
CHLORIDE: 93 mmol/L — AB (ref 101–111)
CO2: 25 mmol/L (ref 22–32)
Calcium: 9.2 mg/dL (ref 8.9–10.3)
Creatinine, Ser: 4.81 mg/dL — ABNORMAL HIGH (ref 0.44–1.00)
GFR calc Af Amer: 10 mL/min — ABNORMAL LOW (ref >60)
GFR, EST NON AFRICAN AMERICAN: 9 mL/min — AB (ref >60)
Glucose, Bld: 149 mg/dL — ABNORMAL HIGH (ref 65–99)
POTASSIUM: 4 mmol/L (ref 3.5–5.1)
Phosphorus: 4.6 mg/dL (ref 2.5–4.6)
Sodium: 135 mmol/L (ref 135–145)

## 2018-02-08 NOTE — Progress Notes (Signed)
Pulmonary Critical Care Medicine [facility] [place]   PULMONARY SERVICE  PROGRESS NOTE  Date of Service: 02/08/2018  Terri Ellis  QMV:[id number]  DOB: 1956/02/25   DOA: 01/24/2018  Referring Physician: Carron Curie, MD  HPI: Terri Ellis is a 62 y.o. female seen for follow up of Acute on Chronic Respiratory Failure.  Patient is on T collar has been weaning tolerating the PMV fairly well requiring some supplemental oxygen by nasal cannula however  Medications: Reviewed on Rounds  Physical Exam:  Vitals: Temperature 98.2 pulse 88 respiratory rate 22 blood pressure 131/70 saturations 95%  Ventilator Settings aerosolized T collar 40% FiO2  . General: Comfortable at this time . Eyes: Grossly normal lids, irises & conjunctiva . ENT: grossly tongue is normal . Neck: no obvious mass . Cardiovascular: S1-S2 normal no gallop or rub . Respiratory: No rhonchi expansion is equal . Abdomen: Soft nontender . Skin: no rash seen on limited exam . Musculoskeletal: not rigid . Psychiatric:unable to assess . Neurologic: no seizure no involuntary movements         Labs on Admission:  Basic Metabolic Panel: Recent Labs  Lab 02/03/18 0720 02/05/18 0654 02/06/18 1442 02/07/18 0705 02/08/18 1044  NA 136 134* 135 139 135  K 4.0 4.5 4.0 3.4* 4.0  CL 97* 96* 99* 100* 93*  CO2 GLUCOSE 154* 147* 231* 106* 149*  BUN 50* 52* 48* 65* 99*  CREATININE 3.32* 3.49* 3.03* 3.73* 4.81*  CALCIUM 8.6* 8.7* 8.9 9.2 9.2  PHOS 4.4 4.8*  --   --  4.6    Liver Function Tests: Recent Labs  Lab 02/03/18 0720 02/05/18 0654 02/06/18 1442 02/07/18 0705 02/08/18 1044  AST  --   --  278* 180*  --   ALT  --   --  276* 238*  --   ALKPHOS  --   --  777* 733*  --   BILITOT  --   --  7.1* 7.1*  --   PROT  --   --  6.8 6.4*  --   ALBUMIN 2.5* 2.4* 2.3* 2.5* 2.6*   No results for input(s): LIPASE, AMYLASE in the last 168 hours. Recent Labs  Lab  02/07/18 0705  AMMONIA 98*    CBC: Recent Labs  Lab 02/03/18 0720 02/05/18 0654 02/08/18 1044  WBC 21.5* 17.0* 17.9*  HGB 6.9* 7.9* 7.1*  HCT 21.6* 25.7* 22.3*  MCV 100.5* 98.8 99.1  PLT 197 184 178    Cardiac Enzymes: No results for input(s): CKTOTAL, CKMB, CKMBINDEX, TROPONINI in the last 168 hours.  BNP (last 3 results) No results for input(s): BNP in the last 8760 hours.  ProBNP (last 3 results) No results for input(s): PROBNP in the last 8760 hours.  Radiological Exams on Admission: US Abdomen Limited Ruq  Result Date: 02/07/2018 CLINICAL DATA:  Elevated liver function tests. History of PEG tube, respiratory failure, end-stage renal disease on dialysis. EXAM: ULTRASOUND ABDOMEN LIMITED RIGHT UPPER QUADRANT COMPARISON:  None. FINDINGS: Gallbladder: Dependent echogenic sludge. Mild gallbladder wall thickening at 3 mm. No pericholecystic fluid. No sonographic Murphy's sign elicited. Common bile duct: Diameter: 5 mm. Liver: No focal lesion identified. Within normal limits in parenchymal echogenicity. Portal vein is patent on color Doppler imaging with normal direction of blood flow towards the liver. Small RIGHT pleural effusion. IMPRESSION: 1. Gallbladder sludge and mild gallbladder wall thickening without additional findings of acute cholecystitis. 2. Small RIGHT pleural effusion. Electronically Signed  By: Awilda Metro M.D.   On: 02/07/2018 04:28    Assessment/Plan Principal Problem:   Acute on chronic respiratory failure with hypoxia (HCC) Active Problems:   Acute tubular necrosis (HCC)   Lobar pneumonia (HCC)   Severe sepsis with septic shock (HCC)   ARDS (adult respiratory distress syndrome) (HCC)   Acute systolic CHF (congestive heart failure) (HCC)   1. Acute on chronic respiratory failure with hypoxia we will continue with supportive care tolerating well nontender, fairly well we will continue with PMV also 2. Acute tubular necrosis followed by nephrology  will continue with pulmonary toilet supportive care 3. Lobar pneumonia treated with antibiotics 4. ARDS clinically resolved slowly 5. Acute on chronic systolic heart failure continue to monitor fluid status   I have personally seen and evaluated the patient, evaluated laboratory and imaging results, formulated the assessment and plan and placed orders. The Patient requires high complexity decision making for assessment and support.  Case was discussed on Rounds with the Respiratory Therapy Staff  Yevonne Pax, MD [facility] Pulmonary Critical Care Medicine Sleep Medicine

## 2018-02-09 DIAGNOSIS — A419 Sepsis, unspecified organism: Secondary | ICD-10-CM | POA: Diagnosis not present

## 2018-02-09 DIAGNOSIS — R6521 Severe sepsis with septic shock: Secondary | ICD-10-CM | POA: Diagnosis not present

## 2018-02-09 DIAGNOSIS — J80 Acute respiratory distress syndrome: Secondary | ICD-10-CM | POA: Diagnosis not present

## 2018-02-09 DIAGNOSIS — I5021 Acute systolic (congestive) heart failure: Secondary | ICD-10-CM | POA: Diagnosis not present

## 2018-02-09 DIAGNOSIS — J181 Lobar pneumonia, unspecified organism: Secondary | ICD-10-CM | POA: Diagnosis not present

## 2018-02-09 DIAGNOSIS — J9621 Acute and chronic respiratory failure with hypoxia: Secondary | ICD-10-CM | POA: Diagnosis not present

## 2018-02-09 DIAGNOSIS — N170 Acute kidney failure with tubular necrosis: Secondary | ICD-10-CM | POA: Diagnosis not present

## 2018-02-09 LAB — COMPREHENSIVE METABOLIC PANEL
ALBUMIN: 2.6 g/dL — AB (ref 3.5–5.0)
ALT: 241 U/L — ABNORMAL HIGH (ref 14–54)
AST: 250 U/L — AB (ref 15–41)
Alkaline Phosphatase: 879 U/L — ABNORMAL HIGH (ref 38–126)
Anion gap: 14 (ref 5–15)
BUN: 51 mg/dL — ABNORMAL HIGH (ref 6–20)
CO2: 26 mmol/L (ref 22–32)
CREATININE: 3.29 mg/dL — AB (ref 0.44–1.00)
Calcium: 8.9 mg/dL (ref 8.9–10.3)
Chloride: 96 mmol/L — ABNORMAL LOW (ref 101–111)
GFR calc Af Amer: 16 mL/min — ABNORMAL LOW (ref >60)
GFR calc non Af Amer: 14 mL/min — ABNORMAL LOW (ref >60)
Glucose, Bld: 113 mg/dL — ABNORMAL HIGH (ref 65–99)
POTASSIUM: 3.9 mmol/L (ref 3.5–5.1)
SODIUM: 136 mmol/L (ref 135–145)
TOTAL PROTEIN: 6.3 g/dL — AB (ref 6.5–8.1)
Total Bilirubin: 7.6 mg/dL — ABNORMAL HIGH (ref 0.3–1.2)

## 2018-02-09 NOTE — Progress Notes (Signed)
Pulmonary Critical Care Medicine [facility] [place]   PULMONARY SERVICE  PROGRESS NOTE  Date of Service: 02/09/2018  Terri Ellis  ZOX:[id number]  DOB: July 20, 1956   DOA: 01/24/2018  Referring Physician: Carron Curie, MD  HPI: Terri Ellis is a 62 y.o. female seen for follow up of Acute on Chronic Respiratory Failure.  She is on T collar weaning currently is on 40% oxygen with good saturations  Medications: Reviewed on Rounds  Physical Exam:  Vitals: Temperature 97.6 pulse 86 respiratory rate 16 blood pressure 102/49 saturations 97%  Ventilator Settings off the ventilator  . General: Comfortable at this time . Eyes: Grossly normal lids, irises & conjunctiva . ENT: grossly tongue is normal . Neck: no obvious mass . Cardiovascular: S1-S2 normal no gallop or rub . Respiratory: No rhonchi expansion equal . Abdomen: Soft nontender . Skin: no rash seen on limited exam . Musculoskeletal: not rigid . Psychiatric:unable to assess . Neurologic: no seizure no involuntary movements         Labs on Admission:  Basic Metabolic Panel: Recent Labs  Lab 02/03/18 0720 02/05/18 0654 02/06/18 1442 02/07/18 0705 02/08/18 1044 02/09/18 0637  NA 136 134* 135 139 135 136  K 4.0 4.5 4.0 3.4* 4.0 3.9  CL 97* 96* 99* 100* 93* 96*  CO2 GLUCOSE 154* 147* 231* 106* 149* 113*  BUN 50* 52* 48* 65* 99* 51*  CREATININE 3.32* 3.49* 3.03* 3.73* 4.81* 3.29*  CALCIUM 8.6* 8.7* 8.9 9.2 9.2 8.9  PHOS 4.4 4.8*  --   --  4.6  --     Liver Function Tests: Recent Labs  Lab 02/05/18 0654 02/06/18 1442 02/07/18 0705 02/08/18 1044 02/09/18 0637  AST  --  278* 180*  --  250*  ALT  --  276* 238*  --  241*  ALKPHOS  --  777* 733*  --  879*  BILITOT  --  7.1* 7.1*  --  7.6*  PROT  --  6.8 6.4*  --  6.3*  ALBUMIN 2.4* 2.3* 2.5* 2.6* 2.6*   No results for input(s): LIPASE, AMYLASE in the last 168 hours. Recent Labs  Lab 02/07/18 0705  AMMONIA  98*    CBC: Recent Labs  Lab 02/03/18 0720 02/05/18 0654 02/08/18 1044  WBC 21.5* 17.0* 17.9*  HGB 6.9* 7.9* 7.1*  HCT 21.6* 25.7* 22.3*  MCV 100.5* 98.8 99.1  PLT 197 184 178    Cardiac Enzymes: No results for input(s): CKTOTAL, CKMB, CKMBINDEX, TROPONINI in the last 168 hours.  BNP (last 3 results) No results for input(s): BNP in the last 8760 hours.  ProBNP (last 3 results) No results for input(s): PROBNP in the last 8760 hours.  Radiological Exams on Admission: Dg Chest Port 1 View  Result Date: 02/08/2018 CLINICAL DATA:  Pulmonary edema. History of chronic respiratory failure with hypoxia, acute systolic CHF, ARDS, lobar pneumonia and sepsis. EXAM: PORTABLE CHEST 1 VIEW COMPARISON:  Chest x-ray dated 02/02/2018. FINDINGS: Heart size and mediastinal contours appear stable. Tracheostomy tube is appropriately positioned in the midline with tip just below the level of the clavicles. RIGHT IJ catheter appears adequately positioned with tip at the level of the mid/lower SVC. Improved aeration bilaterally indicating improved fluid status. No pleural effusion or pneumothorax seen. IMPRESSION: Improved aeration bilaterally, presumably decreased pulmonary edema, indicating improved fluid status. Electronically Signed   By: Bary Richard M.D.   On: 02/08/2018 20:00   US Abdomen Limited Ruq  Result Date: 02/07/2018 CLINICAL DATA:  Elevated liver function tests. History of PEG tube, respiratory failure, end-stage renal disease on dialysis. EXAM: ULTRASOUND ABDOMEN LIMITED RIGHT UPPER QUADRANT COMPARISON:  None. FINDINGS: Gallbladder: Dependent echogenic sludge. Mild gallbladder wall thickening at 3 mm. No pericholecystic fluid. No sonographic Murphy's sign elicited. Common bile duct: Diameter: 5 mm. Liver: No focal lesion identified. Within normal limits in parenchymal echogenicity. Portal vein is patent on color Doppler imaging with normal direction of blood flow towards the liver. Small  RIGHT pleural effusion. IMPRESSION: 1. Gallbladder sludge and mild gallbladder wall thickening without additional findings of acute cholecystitis. 2. Small RIGHT pleural effusion. Electronically Signed   By: Awilda Metro M.D.   On: 02/07/2018 04:28    Assessment/Plan Principal Problem:   Acute on chronic respiratory failure with hypoxia (HCC) Active Problems:   Acute tubular necrosis (HCC)   Lobar pneumonia (HCC)   Severe sepsis with septic shock (HCC)   ARDS (adult respiratory distress syndrome) (HCC)   Acute systolic CHF (congestive heart failure) (HCC)   1. Acute on chronic respiratory failure with hypoxia we will continue with weaning T collar trials and start with capping right now is on 40% FiO2 try to wean oxygen down as tolerated also.  We will continue supportive care follow 2. Lobar pneumonia treated with antibiotics we will continue to follow 3. Acute renal failure with tubular necrosis nephrology is following continue with dialysis 4. ARDS clinically resolved 5. Acute systolic heart failure compensated   I have personally seen and evaluated the patient, evaluated laboratory and imaging results, formulated the assessment and plan and placed orders. The Patient requires high complexity decision making for assessment and support.  Case was discussed on Rounds with the Respiratory Therapy Staff  Yevonne Pax, MD [facility] Pulmonary Critical Care Medicine Sleep Medicine

## 2018-02-09 NOTE — Progress Notes (Signed)
[facility]  ROUNDING NOTE   Subjective:  Patient laying in bed. Husband at bedside. Completed dialysis yesterday.   Objective:  Vital signs in last 24 hours:  Temperature 97.6 pulse 86 respirations 16 blood pressure 102/49  Physical Exam: General: Critically ill-appearing  Head: Normocephalic, atraumatic. Moist oral mucosal membranes  Eyes: Anicteric  Neck: Tracheostomy in place  Lungs:  Bilateral rhonchi, normal effort  Heart: S1S2 no rubs  Abdomen:  Soft, nontender, PEG  Extremities: 1+ peripheral edema.  Neurologic: Awake, alert, follows simple commands  Skin: No lesions  Access: Right internal jugular PermCath    Basic Metabolic Panel: Recent Labs  Lab 02/03/18 0720 02/05/18 0654 02/06/18 1442 02/07/18 0705 02/08/18 1044 02/09/18 0637  NA 136 134* 135 139 135 136  K 4.0 4.5 4.0 3.4* 4.0 3.9  CL 97* 96* 99* 100* 93* 96*  CO2 GLUCOSE 154* 147* 231* 106* 149* 113*  BUN 50* 52* 48* 65* 99* 51*  CREATININE 3.32* 3.49* 3.03* 3.73* 4.81* 3.29*  CALCIUM 8.6* 8.7* 8.9 9.2 9.2 8.9  PHOS 4.4 4.8*  --   --  4.6  --     Liver Function Tests: Recent Labs  Lab 02/05/18 0654 02/06/18 1442 02/07/18 0705 02/08/18 1044 02/09/18 0637  AST  --  278* 180*  --  250*  ALT  --  276* 238*  --  241*  ALKPHOS  --  777* 733*  --  879*  BILITOT  --  7.1* 7.1*  --  7.6*  PROT  --  6.8 6.4*  --  6.3*  ALBUMIN 2.4* 2.3* 2.5* 2.6* 2.6*   No results for input(s): LIPASE, AMYLASE in the last 168 hours. Recent Labs  Lab 02/07/18 0705  AMMONIA 98*    CBC: Recent Labs  Lab 02/03/18 0720 02/05/18 0654 02/08/18 1044  WBC 21.5* 17.0* 17.9*  HGB 6.9* 7.9* 7.1*  HCT 21.6* 25.7* 22.3*  MCV 100.5* 98.8 99.1  PLT 197 184 178    Cardiac Enzymes: No results for input(s): CKTOTAL, CKMB, CKMBINDEX, TROPONINI in the last 168 hours.  BNP: Invalid input(s): POCBNP  CBG: No results for input(s): GLUCAP in the last 168  hours.  Microbiology: Results for orders placed or performed during the hospital encounter of 01/24/18  Culture, Urine     Status: None   Collection Time: 01/28/18  3:00 AM  Result Value Ref Range Status   Specimen Description URINE, RANDOM  Final   Special Requests NONE  Final   Culture   Final    NO GROWTH Performed at [facility], [address] [place], [place] [address]    Report Status 01/29/2018 FINAL  Final    Coagulation Studies: No results for input(s): LABPROT, INR in the last 72 hours.  Urinalysis: No results for input(s): COLORURINE, LABSPEC, PHURINE, GLUCOSEU, HGBUR, BILIRUBINUR, KETONESUR, PROTEINUR, UROBILINOGEN, NITRITE, LEUKOCYTESUR in the last 72 hours.  Invalid input(s): APPERANCEUR    Imaging: Dg Chest Port 1 View  Result Date: 02/08/2018 CLINICAL DATA:  Pulmonary edema. History of chronic respiratory failure with hypoxia, acute systolic CHF, ARDS, lobar pneumonia and sepsis. EXAM: PORTABLE CHEST 1 VIEW COMPARISON:  Chest x-ray dated 02/02/2018. FINDINGS: Heart size and mediastinal contours appear stable. Tracheostomy tube is appropriately positioned in the midline with tip just below the level of the clavicles. RIGHT IJ catheter appears adequately positioned with tip at the level of the mid/lower SVC. Improved aeration bilaterally indicating improved fluid status. No  pleural effusion or pneumothorax seen. IMPRESSION: Improved aeration bilaterally, presumably decreased pulmonary edema, indicating improved fluid status. Electronically Signed   By: Bary Richard M.D.   On: 02/08/2018 20:00     Medications:       Assessment/ Plan:  62 y.o. female with a PMHx of congestive heart failure, chronic kidney disease stage III, acute respiratory failure status post tracheostomy placement, hypertension, iron deficiency anemia, diabetes mellitus type 2, hyperlipidemia, obesity, who was admitted to [facility] on 01/24/2018 for ongoing treatment of pneumonia,  acute respiratory failure, acute renal failure requiring hemodialysis.  1.  Acute renal failure/chronic kidney disease stage III baseline creatinine 1.8.  Patient was seen by nephrology at the outside hospital.  She was initially started on CRRT and subsequently transitioned to intermittent hemodialysis. -Patient completed hemodialysis yesterday.  No acute indication for dialysis today.  We will plan for dialysis again tomorrow.  2.  Acute respiratory failure.  Patient has air/trach collar on and appears to be tolerating this well.  3.  Anemia of chronic kidney disease.  Hemoglobin has drifted down again and is down to 7.1.  Consider another blood transfusion.  Otherwise work-up as per hospitalist.  4.  Secondary hyperparathyroidism.  Serum phosphorus under good control with most recent serum phosphorus of 4.6.  Repeat tomorrow.  5.  Hyperkalemia.  Now resolved with most recent serum potassium of 3.9.     LOS: 0 Siani Utke 5/15/201912:15 PM

## 2018-02-10 DIAGNOSIS — J80 Acute respiratory distress syndrome: Secondary | ICD-10-CM | POA: Diagnosis not present

## 2018-02-10 DIAGNOSIS — N170 Acute kidney failure with tubular necrosis: Secondary | ICD-10-CM | POA: Diagnosis not present

## 2018-02-10 DIAGNOSIS — J9621 Acute and chronic respiratory failure with hypoxia: Secondary | ICD-10-CM | POA: Diagnosis not present

## 2018-02-10 DIAGNOSIS — I5021 Acute systolic (congestive) heart failure: Secondary | ICD-10-CM | POA: Diagnosis not present

## 2018-02-10 LAB — CBC
HEMATOCRIT: 20.8 % — AB (ref 36.0–46.0)
HEMOGLOBIN: 6.3 g/dL — AB (ref 12.0–15.0)
MCH: 31.2 pg (ref 26.0–34.0)
MCHC: 30.3 g/dL (ref 30.0–36.0)
MCV: 103 fL — ABNORMAL HIGH (ref 78.0–100.0)
Platelets: 187 10*3/uL (ref 150–400)
RBC: 2.02 MIL/uL — AB (ref 3.87–5.11)
RDW: 23.9 % — ABNORMAL HIGH (ref 11.5–15.5)
WBC: 17.8 10*3/uL — ABNORMAL HIGH (ref 4.0–10.5)

## 2018-02-10 LAB — PREPARE RBC (CROSSMATCH)

## 2018-02-10 LAB — COMPREHENSIVE METABOLIC PANEL
ALK PHOS: 817 U/L — AB (ref 38–126)
ALT: 198 U/L — AB (ref 14–54)
ANION GAP: 16 — AB (ref 5–15)
AST: 118 U/L — ABNORMAL HIGH (ref 15–41)
Albumin: 2.6 g/dL — ABNORMAL LOW (ref 3.5–5.0)
BUN: 80 mg/dL — ABNORMAL HIGH (ref 6–20)
CALCIUM: 9.2 mg/dL (ref 8.9–10.3)
CO2: 24 mmol/L (ref 22–32)
Chloride: 96 mmol/L — ABNORMAL LOW (ref 101–111)
Creatinine, Ser: 4.06 mg/dL — ABNORMAL HIGH (ref 0.44–1.00)
GFR calc Af Amer: 13 mL/min — ABNORMAL LOW (ref >60)
GFR calc non Af Amer: 11 mL/min — ABNORMAL LOW (ref >60)
GLUCOSE: 131 mg/dL — AB (ref 65–99)
Potassium: 4 mmol/L (ref 3.5–5.1)
SODIUM: 136 mmol/L (ref 135–145)
Total Bilirubin: 5.9 mg/dL — ABNORMAL HIGH (ref 0.3–1.2)
Total Protein: 6.3 g/dL — ABNORMAL LOW (ref 6.5–8.1)

## 2018-02-10 LAB — PROTIME-INR
INR: 1.07
Prothrombin Time: 13.8 seconds (ref 11.4–15.2)

## 2018-02-10 LAB — PHOSPHORUS: Phosphorus: 4 mg/dL (ref 2.5–4.6)

## 2018-02-10 NOTE — Progress Notes (Signed)
Pulmonary Critical Care Medicine [facility] [place]   PULMONARY SERVICE  PROGRESS NOTE  Date of Service: 02/10/2018  Terri Ellis  ZOX:[id number]  DOB: 1955-10-21   DOA: 01/24/2018  Referring Physician: Carron Curie, MD  HPI: Terri Ellis is a 62 y.o. female seen for follow up of Acute on Chronic Respiratory Failure.  Patient remains on T collar at this time on 40% oxygen has been using 2 L PMV and is able to tolerate the PMV did not pass capping attempt I suspect likely because of her trach size being a 6 I think if we downsize her further this may actually help with capping  Medications: Reviewed on Rounds  Physical Exam:  Vitals: Temperature 97.8 pulse 88 respiratory 19 blood pressure 125/67 saturations 92%  Ventilator Settings off the ventilator on T collar 40% FiO2  . General: Comfortable at this time . Eyes: Grossly normal lids, irises & conjunctiva . ENT: grossly tongue is normal . Neck: no obvious mass . Cardiovascular: S1-S2 normal no gallop or rub . Respiratory: No rhonchi expansion equal . Abdomen: Soft nontender . Skin: no rash seen on limited exam . Musculoskeletal: not rigid . Psychiatric:unable to assess . Neurologic: no seizure no involuntary movements         Labs on Admission:  Basic Metabolic Panel: Recent Labs  Lab 02/05/18 0654 02/06/18 1442 02/07/18 0705 02/08/18 1044 02/09/18 0637 02/10/18 0701  NA 134* 135 139 135 136 136  K 4.5 4.0 3.4* 4.0 3.9 4.0  CL 96* 99* 100* 93* 96* 96*  CO2 GLUCOSE 147* 231* 106* 149* 113* 131*  BUN 52* 48* 65* 99* 51* 80*  CREATININE 3.49* 3.03* 3.73* 4.81* 3.29* 4.06*  CALCIUM 8.7* 8.9 9.2 9.2 8.9 9.2  PHOS 4.8*  --   --  4.6  --  4.0    Liver Function Tests: Recent Labs  Lab 02/06/18 1442 02/07/18 0705 02/08/18 1044 02/09/18 0637 02/10/18 0701  AST 278* 180*  --  250* 118*  ALT 276* 238*  --  241* 198*  ALKPHOS 777* 733*  --  879* 817*  BILITOT  7.1* 7.1*  --  7.6* 5.9*  PROT 6.8 6.4*  --  6.3* 6.3*  ALBUMIN 2.3* 2.5* 2.6* 2.6* 2.6*   No results for input(s): LIPASE, AMYLASE in the last 168 hours. Recent Labs  Lab 02/07/18 0705  AMMONIA 98*    CBC: Recent Labs  Lab 02/05/18 0654 02/08/18 1044 02/10/18 0902  WBC 17.0* 17.9* 17.8*  HGB 7.9* 7.1* 6.3*  HCT 25.7* 22.3* 20.8*  MCV 98.8 99.1 103.0*  PLT 184 178 187    Cardiac Enzymes: No results for input(s): CKTOTAL, CKMB, CKMBINDEX, TROPONINI in the last 168 hours.  BNP (last 3 results) No results for input(s): BNP in the last 8760 hours.  ProBNP (last 3 results) No results for input(s): PROBNP in the last 8760 hours.  Radiological Exams on Admission: Dg Chest Port 1 View  Result Date: 02/08/2018 CLINICAL DATA:  Pulmonary edema. History of chronic respiratory failure with hypoxia, acute systolic CHF, ARDS, lobar pneumonia and sepsis. EXAM: PORTABLE CHEST 1 VIEW COMPARISON:  Chest x-ray dated 02/02/2018. FINDINGS: Heart size and mediastinal contours appear stable. Tracheostomy tube is appropriately positioned in the midline with tip just below the level of the clavicles. RIGHT IJ catheter appears adequately positioned with tip at the level of the mid/lower SVC. Improved aeration bilaterally indicating improved fluid status. No pleural effusion or pneumothorax  seen. IMPRESSION: Improved aeration bilaterally, presumably decreased pulmonary edema, indicating improved fluid status. Electronically Signed   By: Bary Richard M.D.   On: 02/08/2018 20:00   US Abdomen Limited Ruq  Result Date: 02/07/2018 CLINICAL DATA:  Elevated liver function tests. History of PEG tube, respiratory failure, end-stage renal disease on dialysis. EXAM: ULTRASOUND ABDOMEN LIMITED RIGHT UPPER QUADRANT COMPARISON:  None. FINDINGS: Gallbladder: Dependent echogenic sludge. Mild gallbladder wall thickening at 3 mm. No pericholecystic fluid. No sonographic Murphy's sign elicited. Common bile duct: Diameter:  5 mm. Liver: No focal lesion identified. Within normal limits in parenchymal echogenicity. Portal vein is patent on color Doppler imaging with normal direction of blood flow towards the liver. Small RIGHT pleural effusion. IMPRESSION: 1. Gallbladder sludge and mild gallbladder wall thickening without additional findings of acute cholecystitis. 2. Small RIGHT pleural effusion. Electronically Signed   By: Awilda Metro M.D.   On: 02/07/2018 04:28    Assessment/Plan Principal Problem:   Acute on chronic respiratory failure with hypoxia (HCC) Active Problems:   Acute tubular necrosis (HCC)   Lobar pneumonia (HCC)   Severe sepsis with septic shock (HCC)   ARDS (adult respiratory distress syndrome) (HCC)   Acute systolic CHF (congestive heart failure) (HCC)   1. Acute on chronic respiratory failure with hypoxia we will downsize the trach to a #4 and see if patient is able to.  We will continue with pulmonary toilet supportive care 2. Acute tubular necrosis followed by nephrology for dialysis 3. ARDS clinically improving we will continue to follow 4. Lobar pneumonia treated with antibiotics we will continue to follow 5. Acute on chronic systolic heart failure we will continue with present supportive care 6. Severe sepsis clinically resolved   I have personally seen and evaluated the patient, evaluated laboratory and imaging results, formulated the assessment and plan and placed orders. The Patient requires high complexity decision making for assessment and support.  Case was discussed on Rounds with the Respiratory Therapy Staff  Yevonne Pax, MD [facility] Pulmonary Critical Care Medicine Sleep Medicine

## 2018-02-11 DIAGNOSIS — J80 Acute respiratory distress syndrome: Secondary | ICD-10-CM | POA: Diagnosis not present

## 2018-02-11 DIAGNOSIS — N170 Acute kidney failure with tubular necrosis: Secondary | ICD-10-CM | POA: Diagnosis not present

## 2018-02-11 DIAGNOSIS — J9621 Acute and chronic respiratory failure with hypoxia: Secondary | ICD-10-CM | POA: Diagnosis not present

## 2018-02-11 DIAGNOSIS — I5021 Acute systolic (congestive) heart failure: Secondary | ICD-10-CM | POA: Diagnosis not present

## 2018-02-11 NOTE — Progress Notes (Signed)
[facility]  ROUNDING NOTE   Subjective:  Patient seen at bedside. Doing much better.  Tracheostomy has been capped   Objective:  Vital signs in last 24 hours:  Temperature 98.6 pulse 94 respirations 23 blood pressure 121/66  Physical Exam: General: Critically ill-appearing  Head: Normocephalic, atraumatic. Moist oral mucosal membranes  Eyes: Anicteric  Neck: Tracheostomy in place  Lungs:  Bilateral rhonchi, normal effort  Heart: S1S2 no rubs  Abdomen:  Soft, nontender, PEG  Extremities: 1+ peripheral edema.  Neurologic: Awake, alert, follows simple commands  Skin: No lesions  Access: Right internal jugular PermCath    Basic Metabolic Panel: Recent Labs  Lab 02/05/18 0654 02/06/18 1442 02/07/18 0705 02/08/18 1044 02/09/18 0637 02/10/18 0701  NA 134* 135 139 135 136 136  K 4.5 4.0 3.4* 4.0 3.9 4.0  CL 96* 99* 100* 93* 96* 96*  CO2 GLUCOSE 147* 231* 106* 149* 113* 131*  BUN 52* 48* 65* 99* 51* 80*  CREATININE 3.49* 3.03* 3.73* 4.81* 3.29* 4.06*  CALCIUM 8.7* 8.9 9.2 9.2 8.9 9.2  PHOS 4.8*  --   --  4.6  --  4.0    Liver Function Tests: Recent Labs  Lab 02/06/18 1442 02/07/18 0705 02/08/18 1044 02/09/18 0637 02/10/18 0701  AST 278* 180*  --  250* 118*  ALT 276* 238*  --  241* 198*  ALKPHOS 777* 733*  --  879* 817*  BILITOT 7.1* 7.1*  --  7.6* 5.9*  PROT 6.8 6.4*  --  6.3* 6.3*  ALBUMIN 2.3* 2.5* 2.6* 2.6* 2.6*   No results for input(s): LIPASE, AMYLASE in the last 168 hours. Recent Labs  Lab 02/07/18 0705  AMMONIA 98*    CBC: Recent Labs  Lab 02/05/18 0654 02/08/18 1044 02/10/18 0902  WBC 17.0* 17.9* 17.8*  HGB 7.9* 7.1* 6.3*  HCT 25.7* 22.3* 20.8*  MCV 98.8 99.1 103.0*  PLT 184 178 187    Cardiac Enzymes: No results for input(s): CKTOTAL, CKMB, CKMBINDEX, TROPONINI in the last 168 hours.  BNP: Invalid input(s): POCBNP  CBG: No results for input(s): GLUCAP in the last 168  hours.  Microbiology: Results for orders placed or performed during the hospital encounter of 01/24/18  Culture, Urine     Status: None   Collection Time: 01/28/18  3:00 AM  Result Value Ref Range Status   Specimen Description URINE, RANDOM  Final   Special Requests NONE  Final   Culture   Final    NO GROWTH Performed at [facility], [address], [place], [place] [address]    Report Status 01/29/2018 FINAL  Final    Coagulation Studies: Recent Labs    02/10/18 0701  LABPROT 13.8  INR 1.07    Urinalysis: No results for input(s): COLORURINE, LABSPEC, PHURINE, GLUCOSEU, HGBUR, BILIRUBINUR, KETONESUR, PROTEINUR, UROBILINOGEN, NITRITE, LEUKOCYTESUR in the last 72 hours.  Invalid input(s): APPERANCEUR    Imaging: No results found.   Medications:       Assessment/ Plan:  62 y.o. female with a PMHx of congestive heart failure, chronic kidney disease stage III, acute respiratory failure status post tracheostomy placement, hypertension, iron deficiency anemia, diabetes mellitus type 2, hyperlipidemia, obesity, who was admitted to [facility] on 01/24/2018 for ongoing treatment of pneumonia, acute respiratory failure, acute renal failure requiring hemodialysis.  1.  Acute renal failure/chronic kidney disease stage III baseline creatinine 1.8.  Patient was seen by nephrology at the outside hospital.  She was initially started on CRRT and subsequently transitioned to intermittent hemodialysis. -Patient still has significant acute renal failure.  BUN and creatinine still high.  Due for dialysis again tomorrow.  2.  Acute respiratory failure.  Gas to be currently capped and patient is making good progress from acute respiratory failure standpoint.  3.  Anemia of chronic kidney disease.  Hemoglobin down to 6.3.  Consider blood transfusion.  Defer to hospitalist.  4.  Secondary hyperparathyroidism.  Phosphorus currently 4.0 and acceptable.  Continue to monitor.  5.   Hyperkalemia.  Resolved, potassium currently 4.0.     LOS: 0 Efton Thomley 5/17/20198:37 AM

## 2018-02-11 NOTE — Progress Notes (Signed)
Pulmonary Critical Care Medicine [facility] [place]   PULMONARY SERVICE  PROGRESS NOTE  Date of Service: 02/11/2018  Terri Ellis  JXB:[id number]  DOB: [birth year]   DOA: 01/24/2018  Referring Physician: Carron Curie, MD  HPI: Terri Ellis is a 62 y.o. female seen for follow up of Acute on Chronic Respiratory Failure.  Patient currently is on T collar has been doing fairly well patient is on 20% FiO2 tolerating the PMV fairly well.  Today we should be able to hopefully  Medications: Reviewed on Rounds  Physical Exam:  Vitals: Temperature 98.6 pulse 94 respiratory rate 23 blood pressure 121/60 saturations 96%  Ventilator Settings T collar FiO2 28%  . General: Comfortable at this time . Eyes: Grossly normal lids, irises & conjunctiva . ENT: grossly tongue is normal . Neck: no obvious mass . Cardiovascular: S1-S2 normal no gallop or rub . Respiratory: No rhonchi . Abdomen: Soft . Skin: no rash seen on limited exam . Musculoskeletal: not rigid . Psychiatric:unable to assess . Neurologic: no seizure no involuntary movements         Labs on Admission:  Basic Metabolic Panel: Recent Labs  Lab 02/05/18 0654 02/06/18 1442 02/07/18 0705 02/08/18 1044 02/09/18 0637 02/10/18 0701  NA 134* 135 139 135 136 136  K 4.5 4.0 3.4* 4.0 3.9 4.0  CL 96* 99* 100* 93* 96* 96*  CO2 GLUCOSE 147* 231* 106* 149* 113* 131*  BUN 52* 48* 65* 99* 51* 80*  CREATININE 3.49* 3.03* 3.73* 4.81* 3.29* 4.06*  CALCIUM 8.7* 8.9 9.2 9.2 8.9 9.2  PHOS 4.8*  --   --  4.6  --  4.0    Liver Function Tests: Recent Labs  Lab 02/06/18 1442 02/07/18 0705 02/08/18 1044 02/09/18 0637 02/10/18 0701  AST 278* 180*  --  250* 118*  ALT 276* 238*  --  241* 198*  ALKPHOS 777* 733*  --  879* 817*  BILITOT 7.1* 7.1*  --  7.6* 5.9*  PROT 6.8 6.4*  --  6.3* 6.3*  ALBUMIN 2.3* 2.5* 2.6* 2.6* 2.6*   No results for input(s): LIPASE, AMYLASE in the last 168  hours. Recent Labs  Lab 02/07/18 0705  AMMONIA 98*    CBC: Recent Labs  Lab 02/05/18 0654 02/08/18 1044 02/10/18 0902  WBC 17.0* 17.9* 17.8*  HGB 7.9* 7.1* 6.3*  HCT 25.7* 22.3* 20.8*  MCV 98.8 99.1 103.0*  PLT 184 178 187    Cardiac Enzymes: No results for input(s): CKTOTAL, CKMB, CKMBINDEX, TROPONINI in the last 168 hours.  BNP (last 3 results) No results for input(s): BNP in the last 8760 hours.  ProBNP (last 3 results) No results for input(s): PROBNP in the last 8760 hours.  Radiological Exams on Admission: Dg Chest Port 1 View  Result Date: 02/08/2018 CLINICAL DATA:  Pulmonary edema. History of chronic respiratory failure with hypoxia, acute systolic CHF, ARDS, lobar pneumonia and sepsis. EXAM: PORTABLE CHEST 1 VIEW COMPARISON:  Chest x-ray dated 02/02/2018. FINDINGS: Heart size and mediastinal contours appear stable. Tracheostomy tube is appropriately positioned in the midline with tip just below the level of the clavicles. RIGHT IJ catheter appears adequately positioned with tip at the level of the mid/lower SVC. Improved aeration bilaterally indicating improved fluid status. No pleural effusion or pneumothorax seen. IMPRESSION: Improved aeration bilaterally, presumably decreased pulmonary edema, indicating improved fluid status. Electronically Signed   By: Bary Richard M.D.   On: 02/08/2018 20:00  Assessment/Plan Principal Problem:   Acute on chronic respiratory failure with hypoxia (HCC) Active Problems:   Acute tubular necrosis (HCC)   Lobar pneumonia (HCC)   Severe sepsis with septic shock (HCC)   ARDS (adult respiratory distress syndrome) (HCC)   Acute systolic CHF (congestive heart failure) (HCC)   1. Acute on chronic respiratory failure with hypoxia continue with T collar remains and is ordered patient is doing fairly well has also been tolerating PMV this afternoon hopefully we can try to start capping 2. Acute tubular necrosis with renal failure  patient is on dialysis which will be continued we will continue with supportive care 3. Severe sepsis and shock clinically resolved 4. ARDS improving 5. Acute systolic heart failure chest x-ray showed clinical improvement we will continue to monitor   I have personally seen and evaluated the patient, evaluated laboratory and imaging results, formulated the assessment and plan and placed orders. The Patient requires high complexity decision making for assessment and support.  Case was discussed on Rounds with the Respiratory Therapy Staff  Yevonne Pax, MD [facility] Pulmonary Critical Care Medicine Sleep Medicine

## 2018-02-12 DIAGNOSIS — J80 Acute respiratory distress syndrome: Secondary | ICD-10-CM | POA: Diagnosis not present

## 2018-02-12 DIAGNOSIS — N170 Acute kidney failure with tubular necrosis: Secondary | ICD-10-CM | POA: Diagnosis not present

## 2018-02-12 DIAGNOSIS — J9621 Acute and chronic respiratory failure with hypoxia: Secondary | ICD-10-CM | POA: Diagnosis not present

## 2018-02-12 DIAGNOSIS — I5021 Acute systolic (congestive) heart failure: Secondary | ICD-10-CM | POA: Diagnosis not present

## 2018-02-12 LAB — RENAL FUNCTION PANEL
Albumin: 2.8 g/dL — ABNORMAL LOW (ref 3.5–5.0)
Anion gap: 18 — ABNORMAL HIGH (ref 5–15)
BUN: 77 mg/dL — AB (ref 6–20)
CHLORIDE: 93 mmol/L — AB (ref 101–111)
CO2: 23 mmol/L (ref 22–32)
CREATININE: 3.61 mg/dL — AB (ref 0.44–1.00)
Calcium: 9.6 mg/dL (ref 8.9–10.3)
GFR calc non Af Amer: 13 mL/min — ABNORMAL LOW (ref >60)
GFR, EST AFRICAN AMERICAN: 15 mL/min — AB (ref >60)
GLUCOSE: 124 mg/dL — AB (ref 65–99)
Phosphorus: 5.2 mg/dL — ABNORMAL HIGH (ref 2.5–4.6)
Potassium: 4.6 mmol/L (ref 3.5–5.1)
SODIUM: 134 mmol/L — AB (ref 135–145)

## 2018-02-12 LAB — CBC
HCT: 20.4 % — ABNORMAL LOW (ref 36.0–46.0)
Hemoglobin: 6.5 g/dL — CL (ref 12.0–15.0)
MCH: 32 pg (ref 26.0–34.0)
MCHC: 31.9 g/dL (ref 30.0–36.0)
MCV: 100.5 fL — AB (ref 78.0–100.0)
PLATELETS: 173 10*3/uL (ref 150–400)
RBC: 2.03 MIL/uL — ABNORMAL LOW (ref 3.87–5.11)
RDW: 22.8 % — AB (ref 11.5–15.5)
WBC: 14.8 10*3/uL — ABNORMAL HIGH (ref 4.0–10.5)

## 2018-02-12 NOTE — Progress Notes (Signed)
Pulmonary Critical Care Medicine [facility] [place]   PULMONARY SERVICE  PROGRESS NOTE  Date of Service: 02/12/2018  Terri Ellis  ZOX:[id number]  DOB: 1956-02-06   DOA: 01/24/2018  Referring Physician: Carron Curie, MD  HPI: Terri Ellis is a 62 y.o. female seen for follow up of Acute on Chronic Respiratory Failure.  Patient is capping doing fairly well has no distress noted.  Medications: Reviewed on Rounds  Physical Exam:  Vitals: Temperature 97.2 pulse 90 respiratory rate 22 blood pressure 106/58 saturations 94%  Ventilator Settings off the ventilator capping  . General: Comfortable at this time . Eyes: Grossly normal lids, irises & conjunctiva . ENT: grossly tongue is normal . Neck: no obvious mass . Cardiovascular: S1 S2 normal no gallop . Respiratory: No rhonchi expansion equal . Abdomen: soft . Skin: no rash seen on limited exam . Musculoskeletal: not rigid . Psychiatric:unable to assess . Neurologic: no seizure no involuntary movements         Labs on Admission:  Basic Metabolic Panel: Recent Labs  Lab 02/07/18 0705 02/08/18 1044 02/09/18 0637 02/10/18 0701 02/12/18 0715  NA 139 135 136 136 134*  K 3.4* 4.0 3.9 4.0 4.6  CL 100* 93* 96* 96* 93*  CO2 GLUCOSE 106* 149* 113* 131* 124*  BUN 65* 99* 51* 80* 77*  CREATININE 3.73* 4.81* 3.29* 4.06* 3.61*  CALCIUM 9.2 9.2 8.9 9.2 9.6  PHOS  --  4.6  --  4.0 5.2*    Liver Function Tests: Recent Labs  Lab 02/06/18 1442 02/07/18 0705 02/08/18 1044 02/09/18 0637 02/10/18 0701 02/12/18 0715  AST 278* 180*  --  250* 118*  --   ALT 276* 238*  --  241* 198*  --   ALKPHOS 777* 733*  --  879* 817*  --   BILITOT 7.1* 7.1*  --  7.6* 5.9*  --   PROT 6.8 6.4*  --  6.3* 6.3*  --   ALBUMIN 2.3* 2.5* 2.6* 2.6* 2.6* 2.8*   No results for input(s): LIPASE, AMYLASE in the last 168 hours. Recent Labs  Lab 02/07/18 0705  AMMONIA 98*    CBC: Recent Labs  Lab  02/08/18 1044 02/10/18 0902 02/12/18 0715  WBC 17.9* 17.8* 14.8*  HGB 7.1* 6.3* 6.5*  HCT 22.3* 20.8* 20.4*  MCV 99.1 103.0* 100.5*  PLT 178 187 173    Cardiac Enzymes: No results for input(s): CKTOTAL, CKMB, CKMBINDEX, TROPONINI in the last 168 hours.  BNP (last 3 results) No results for input(s): BNP in the last 8760 hours.  ProBNP (last 3 results) No results for input(s): PROBNP in the last 8760 hours.  Radiological Exams on Admission: Dg Chest Port 1 View  Result Date: 02/08/2018 CLINICAL DATA:  Pulmonary edema. History of chronic respiratory failure with hypoxia, acute systolic CHF, ARDS, lobar pneumonia and sepsis. EXAM: PORTABLE CHEST 1 VIEW COMPARISON:  Chest x-ray dated 02/02/2018. FINDINGS: Heart size and mediastinal contours appear stable. Tracheostomy tube is appropriately positioned in the midline with tip just below the level of the clavicles. RIGHT IJ catheter appears adequately positioned with tip at the level of the mid/lower SVC. Improved aeration bilaterally indicating improved fluid status. No pleural effusion or pneumothorax seen. IMPRESSION: Improved aeration bilaterally, presumably decreased pulmonary edema, indicating improved fluid status. Electronically Signed   By: Bary Richard M.D.   On: 02/08/2018 20:00    Assessment/Plan Principal Problem:   Acute on chronic respiratory failure with hypoxia (HCC)  Active Problems:   Acute tubular necrosis (HCC)   Lobar pneumonia (HCC)   Severe sepsis with septic shock (HCC)   ARDS (adult respiratory distress syndrome) (HCC)   Acute systolic CHF (congestive heart failure) (HCC)   1. Acute on chronic respiratory failure with hypoxia we will continue with capping trials advance as tolerated. 2. Lobar pneumonia treated with antibiotics we will continue with supportive care 3. Severe sepsis with shock resolved patient is hemodynamically stable 4. ARDS clinically improving 5. Acute systolic heart heart failure we will  monitor x-rays and fluid status. 6. Acute renal failure followed by nephrology dialyze as needed   I have personally seen and evaluated the patient, evaluated laboratory and imaging results, formulated the assessment and plan and placed orders. The Patient requires high complexity decision making for assessment and support.  Case was discussed on Rounds with the Respiratory Therapy Staff  Yevonne Pax, MD [facility] Pulmonary Critical Care Medicine Sleep Medicine

## 2018-02-13 ENCOUNTER — Encounter (HOSPITAL_BASED_OUTPATIENT_CLINIC_OR_DEPARTMENT_OTHER): Payer: Self-pay

## 2018-02-13 DIAGNOSIS — N189 Chronic kidney disease, unspecified: Secondary | ICD-10-CM

## 2018-02-13 LAB — TYPE AND SCREEN
ABO/RH(D): O POS
Antibody Screen: NEGATIVE
UNIT DIVISION: 0
Unit division: 0

## 2018-02-13 LAB — BPAM RBC
BLOOD PRODUCT EXPIRATION DATE: 201906072359
BLOOD PRODUCT EXPIRATION DATE: 201906122359
ISSUE DATE / TIME: 201905161342
ISSUE DATE / TIME: 201905181131
UNIT TYPE AND RH: 5100
Unit Type and Rh: 5100

## 2018-02-13 NOTE — Progress Notes (Signed)
Bilateral Upper Extremity Vein Map  Right Cephalic  Segment Diameter Depth Comment  Axilla 3.4 mm 15.8 mm   1. Prox 3.2 mm 21.2 mm   2. Mid upper arm 3.2 mm 20.0 mm   3. Above AC 2.9 mm 16 mm   4. In AC 4.3 mm 4.1 mm   5. Below AC mm mm   6. Mid forearm 2.1 mm 2.1 mm   7. Wrist 0.1 mm 3.2 mm    Left Cephalic  Segment Diameter Depth Comment  Axilla 3.8 mm 21 mm   1. Prox 3.8 mm 19 mm   2. Mid upper arm 3.8 mm 14 mm   3. Above AC 4.3 mm 14 mm   4. In AC 4.8 mm 8.4 mm   5. Below AC 3.2 mm 9.3 mm   6. Mid forearm 3.3 mm 5.0 mm   7. Wrist 1.5 mm 3.8 mm    Levin Bacon- RDMS, RVT 2:32 PM  02/13/2018

## 2018-02-14 DIAGNOSIS — N186 End stage renal disease: Secondary | ICD-10-CM | POA: Diagnosis not present

## 2018-02-14 DIAGNOSIS — I5021 Acute systolic (congestive) heart failure: Secondary | ICD-10-CM

## 2018-02-14 DIAGNOSIS — J80 Acute respiratory distress syndrome: Secondary | ICD-10-CM | POA: Diagnosis not present

## 2018-02-14 DIAGNOSIS — J9621 Acute and chronic respiratory failure with hypoxia: Secondary | ICD-10-CM | POA: Diagnosis not present

## 2018-02-14 DIAGNOSIS — Z992 Dependence on renal dialysis: Secondary | ICD-10-CM | POA: Diagnosis not present

## 2018-02-14 DIAGNOSIS — N170 Acute kidney failure with tubular necrosis: Secondary | ICD-10-CM | POA: Diagnosis not present

## 2018-02-14 LAB — COMPREHENSIVE METABOLIC PANEL
ALBUMIN: 2.7 g/dL — AB (ref 3.5–5.0)
ALT: 225 U/L — ABNORMAL HIGH (ref 14–54)
ANION GAP: 16 — AB (ref 5–15)
AST: 188 U/L — ABNORMAL HIGH (ref 15–41)
Alkaline Phosphatase: 713 U/L — ABNORMAL HIGH (ref 38–126)
BILIRUBIN TOTAL: 6.7 mg/dL — AB (ref 0.3–1.2)
BUN: 87 mg/dL — ABNORMAL HIGH (ref 6–20)
CO2: 25 mmol/L (ref 22–32)
Calcium: 9.3 mg/dL (ref 8.9–10.3)
Chloride: 92 mmol/L — ABNORMAL LOW (ref 101–111)
Creatinine, Ser: 3.64 mg/dL — ABNORMAL HIGH (ref 0.44–1.00)
GFR calc Af Amer: 14 mL/min — ABNORMAL LOW (ref >60)
GFR, EST NON AFRICAN AMERICAN: 12 mL/min — AB (ref >60)
GLUCOSE: 117 mg/dL — AB (ref 65–99)
POTASSIUM: 4.2 mmol/L (ref 3.5–5.1)
Sodium: 133 mmol/L — ABNORMAL LOW (ref 135–145)
TOTAL PROTEIN: 6.4 g/dL — AB (ref 6.5–8.1)

## 2018-02-14 NOTE — Progress Notes (Signed)
[facility]  ROUNDING NOTE   Subjective:  Patient appears to be doing better overall. Tracheostomy remains capped. We will be switching the patient to Monday, Wednesday, Friday dialysis. Therefore she will have dialysis today.  Prior to this her last dialysis was on Saturday.   Objective:  Vital signs in last 24 hours:  Temperature 97.2 pulse 86 respirations 16 blood pressure 99/46  Physical Exam: General: Critically ill-appearing  Head: Normocephalic, atraumatic. Moist oral mucosal membranes  Eyes: Anicteric  Neck: Tracheostomy in place, capped  Lungs:  Bilateral rhonchi, normal effort  Heart: S1S2 no rubs  Abdomen:  Soft, nontender, PEG  Extremities: 1+ peripheral edema.  Neurologic: Awake, alert, follows simple commands  Skin: No lesions  Access: Right internal jugular PermCath    Basic Metabolic Panel: Recent Labs  Lab 02/08/18 1044 02/09/18 0637 02/10/18 0701 02/12/18 0715 02/14/18 0719  NA 135 136 136 134* 133*  K 4.0 3.9 4.0 4.6 4.2  CL 93* 96* 96* 93* 92*  CO2 GLUCOSE 149* 113* 131* 124* 117*  BUN 99* 51* 80* 77* 87*  CREATININE 4.81* 3.29* 4.06* 3.61* 3.64*  CALCIUM 9.2 8.9 9.2 9.6 9.3  PHOS 4.6  --  4.0 5.2*  --     Liver Function Tests: Recent Labs  Lab 02/08/18 1044 02/09/18 0637 02/10/18 0701 02/12/18 0715 02/14/18 0719  AST  --  250* 118*  --  188*  ALT  --  241* 198*  --  225*  ALKPHOS  --  879* 817*  --  713*  BILITOT  --  7.6* 5.9*  --  6.7*  PROT  --  6.3* 6.3*  --  6.4*  ALBUMIN 2.6* 2.6* 2.6* 2.8* 2.7*   No results for input(s): LIPASE, AMYLASE in the last 168 hours. No results for input(s): AMMONIA in the last 168 hours.  CBC: Recent Labs  Lab 02/08/18 1044 02/10/18 0902 02/12/18 0715  WBC 17.9* 17.8* 14.8*  HGB 7.1* 6.3* 6.5*  HCT 22.3* 20.8* 20.4*  MCV 99.1 103.0* 100.5*  PLT 178 187 173    Cardiac Enzymes: No results for input(s): CKTOTAL, CKMB, CKMBINDEX, TROPONINI in the last 168  hours.  BNP: Invalid input(s): POCBNP  CBG: No results for input(s): GLUCAP in the last 168 hours.  Microbiology: Results for orders placed or performed during the hospital encounter of 01/24/18  Culture, Urine     Status: None   Collection Time: 01/28/18  3:00 AM  Result Value Ref Range Status   Specimen Description URINE, RANDOM  Final   Special Requests NONE  Final   Culture   Final    NO GROWTH Performed at [facility], [address] [place], [place] [address]    Report Status 01/29/2018 FINAL  Final    Coagulation Studies: No results for input(s): LABPROT, INR in the last 72 hours.  Urinalysis: No results for input(s): COLORURINE, LABSPEC, PHURINE, GLUCOSEU, HGBUR, BILIRUBINUR, KETONESUR, PROTEINUR, UROBILINOGEN, NITRITE, LEUKOCYTESUR in the last 72 hours.  Invalid input(s): APPERANCEUR    Imaging: No results found.   Medications:       Assessment/ Plan:  62 y.o. female with a PMHx of congestive heart failure, chronic kidney disease stage III, acute respiratory failure status post tracheostomy placement, hypertension, iron deficiency anemia, diabetes mellitus type 2, hyperlipidemia, obesity, who was admitted to [facility] on 01/24/2018 for ongoing treatment of pneumonia, acute respiratory failure, acute renal failure requiring hemodialysis.  1.  Acute renal failure/chronic  kidney disease stage III baseline creatinine 1.8.  Patient was seen by nephrology at the outside hospital.  She was initially started on CRRT and subsequently transitioned to intermittent hemodialysis. -Patient continues to have significant azotemia.  BUN up to 87 with a creatinine of 3.6.  We will plan to switch patient to Monday, Wednesday, Friday dialysis.  Her last dialysis treatment was on Saturday.  Ultrafiltration target 1 kg today.  2.  Acute respiratory failure.  Tracheostomy remains capped.  Patient is able to verbalize.  3.  Anemia of chronic kidney disease.   Hemoglobin remains low at 6.5.  Recommend rechecking CBC but defer this to hospitalist.  4.  Secondary hyperparathyroidism.  Phosphorus 5.2 and at target.  5.  Hyperkalemia.  Serum potassium normal now at 4.2.     LOS: 0 Shahil Speegle 5/20/20198:34 AM

## 2018-02-14 NOTE — Progress Notes (Signed)
Pulmonary Critical Care Medicine [facility] [place]   PULMONARY SERVICE  PROGRESS NOTE  Date of Service: 02/14/2018  Terri Ellis  ZOX:[id number]  DOB: Sep 10, 1956   DOA: 01/24/2018  Referring Physician: Carron Curie, MD  HPI: Terri Ellis is a 62 y.o. female seen for follow up of Acute on Chronic Respiratory Failure.  Patient is capping looks good right now.  She has no increase in secretions noted.  Denies any distress  Medications: Reviewed on Rounds  Physical Exam:  Vitals: Temperature 97.2 pulse 86 respiratory rate 16 blood pressure 99/46 saturations 97%  Ventilator Settings off the ventilator capping  . General: Comfortable at this time . Eyes: Grossly normal lids, irises & conjunctiva . ENT: grossly tongue is normal . Neck: no obvious mass . Cardiovascular: S1 S2 normal no gallop . Respiratory: No rhonchi . Abdomen: soft . Skin: no rash seen on limited exam . Musculoskeletal: not rigid . Psychiatric:unable to assess . Neurologic: no seizure no involuntary movements         Labs on Admission:  Basic Metabolic Panel: Recent Labs  Lab 02/08/18 1044 02/09/18 0637 02/10/18 0701 02/12/18 0715 02/14/18 0719  NA 135 136 136 134* 133*  K 4.0 3.9 4.0 4.6 4.2  CL 93* 96* 96* 93* 92*  CO2 GLUCOSE 149* 113* 131* 124* 117*  BUN 99* 51* 80* 77* 87*  CREATININE 4.81* 3.29* 4.06* 3.61* 3.64*  CALCIUM 9.2 8.9 9.2 9.6 9.3  PHOS 4.6  --  4.0 5.2*  --     Liver Function Tests: Recent Labs  Lab 02/08/18 1044 02/09/18 0637 02/10/18 0701 02/12/18 0715 02/14/18 0719  AST  --  250* 118*  --  188*  ALT  --  241* 198*  --  225*  ALKPHOS  --  879* 817*  --  713*  BILITOT  --  7.6* 5.9*  --  6.7*  PROT  --  6.3* 6.3*  --  6.4*  ALBUMIN 2.6* 2.6* 2.6* 2.8* 2.7*   No results for input(s): LIPASE, AMYLASE in the last 168 hours. No results for input(s): AMMONIA in the last 168 hours.  CBC: Recent Labs  Lab  02/08/18 1044 02/10/18 0902 02/12/18 0715  WBC 17.9* 17.8* 14.8*  HGB 7.1* 6.3* 6.5*  HCT 22.3* 20.8* 20.4*  MCV 99.1 103.0* 100.5*  PLT 178 187 173    Cardiac Enzymes: No results for input(s): CKTOTAL, CKMB, CKMBINDEX, TROPONINI in the last 168 hours.  BNP (last 3 results) No results for input(s): BNP in the last 8760 hours.  ProBNP (last 3 results) No results for input(s): PROBNP in the last 8760 hours.  Radiological Exams on Admission: No results found.  Assessment/Plan Principal Problem:   Acute on chronic respiratory failure with hypoxia (HCC) Active Problems:   Acute tubular necrosis (HCC)   Lobar pneumonia (HCC)   Severe sepsis with septic shock (HCC)   ARDS (adult respiratory distress syndrome) (HCC)   Acute systolic CHF (congestive heart failure) (HCC)   1. Acute on chronic respiratory failure with hypoxia we will continue with supportive care patient has been doing fine with capping trials. 2. Acute tubular necrosis with renal failure followed by nephrology for dialysis continue with present management 3. ARDS clinically resolved 4. Severe sepsis resolved hemodynamically stable 5. Lobar pneumonia treated with antibiotics we will continue to monitor 6. Acute systolic heart failure stable at this time   I have personally seen and evaluated the patient, evaluated laboratory  and imaging results, formulated the assessment and plan and placed orders. The Patient requires high complexity decision making for assessment and support.  Case was discussed on Rounds with the Respiratory Therapy Staff  Allyne Gee, MD [facility] Pulmonary Critical Care Medicine Sleep Medicine

## 2018-02-14 NOTE — Consult Note (Addendum)
[facility] [place] HISTORY AND PHYSICAL   History of Present Illness:  Patient is a 62 y.o. year old female who is in needs permanent hemodialysis access. The patient is right handed .  The patient is currently on hemodialysis via right [facility]  The cause of renal failure is thought to be secondary to AKI on CKD.  Other chronic medical problems include Acute respiratory failure and GI bleed.  Current medications: Cefepime 1 g twice daily, Humalog sliding scale, Pepcid 20 mg twice daily, heparin 5000 units subcutaneous every 8 hours, pro-stat 30 cc p.o. twice daily, Solu-Cortef 50 mg IV every 12 hours, multivitamin 1 tablet daily     Past Medical History:  Diagnosis Date  . Acute on chronic respiratory failure with hypoxia (HCC) 01/25/2018  . Acute systolic CHF (congestive heart failure) (HCC) 01/25/2018  . Acute tubular necrosis (HCC) 01/25/2018  . ARDS (adult respiratory distress syndrome) (HCC) 01/25/2018  . Lobar pneumonia (HCC) 01/25/2018  . Severe sepsis with septic shock (HCC) 01/25/2018    Past Surgical History:  Procedure Laterality Date  . PEG PLACEMENT    . TRACHEOSTOMY       Social History Social History   Tobacco Use  . Smoking status: Unknown If Ever Smoked  . Smokeless tobacco: Never Used  Substance Use Topics  . Alcohol use: Not Currently  . Drug use: Not Currently    Family History Family History  Family history unknown: Yes    Allergies  Allergies  Allergen Reactions  . Diclofenac Other (See Comments)    Intolerance, elevated LFT Elevated LFT   . Pravastatin Other (See Comments)    Chest pain Chest pain   . Amitriptyline Other (See Comments) and Rash    Unknown   . Atorvastatin Other (See Comments)    Pain Hurt all over   . Desipramine Other (See Comments)    "felt funny" Felt funny   . Indomethacin Other (See Comments) and Nausea And Vomiting    GI upset   . Oxycodone Other (See Comments)    Caused hallucinations &  falling  . Penicillins Hives and Rash    Pt took Keflex in 1996 with no problem   . Phentermine-Topiramate Other (See Comments)    Yeast infection It gave me a really bad yeast infection   . Prednisone Other (See Comments)    Dysphoria dysphoria   . Clarithromycin Other (See Comments)    Hives  . Diphenhydramine Hives     No current facility-administered medications for this encounter.     ROS:   General:  No weight loss, Fever, chills  HEENT: No recent headaches, no nasal bleeding, no visual changes, no sore throat  Neurologic: No dizziness, blackouts, seizures. No recent symptoms of stroke or mini- stroke. No recent episodes of slurred speech, or temporary blindness.  Cardiac: No recent episodes of chest pain/pressure, no shortness of breath at rest.  No shortness of breath with exertion.  Denies history of atrial fibrillation or irregular heartbeat  Vascular: No history of rest pain in feet.  No history of claudication.  No history of non-healing ulcer, No history of DVT   Pulmonary: No home oxygen, no productive cough, no hemoptysis,  No asthma or wheezing  Musculoskeletal:   Arthritis,  Low back pain,   Joint pain  Hematologic:No history of hypercoagulable state.  No history of easy bleeding.  No history of anemia  Gastrointestinal: No hematochezia or melena,  No gastroesophageal reflux, no  trouble swallowing  Urinary:  chronic Kidney disease,  on HD -  MWF or  TTHS,  Burning with urination,  Frequent urination,  Difficulty urinating;   Skin: No rashes  Psychological: No history of anxiety,  No history of depression   Physical Examination    General:  Alert and oriented, no acute distress HEENT: Normal, normocephalic Neck: No bruit or JVD Pulmonary: Tracheostomy in place Cardiac:Regular Rate and Rhythm without murmur Gastrointestinal: Soft, non-tender, non-distended, no mass, no scars Skin: No rash Extremity Pulses:  2+  radial, brachial pulses bilaterally Musculoskeletal: global extremities with edema  Neurologic: Upper and lower extremity motor 5/5 and symmetric  DATA:  Right UE cephalic Segment Diameter Depth Comment  Axilla 3.4 mm 15.8 mm   1. Prox 3.2 mm 21.2 mm   2. Mid upper arm 3.2 mm 20.0 mm   3. Above AC 2.9 mm 16 mm   4. In AC 4.3 mm 4.1 mm   5. Below AC mm mm   6. Mid forearm 2.1 mm 2.1 mm   7. Wrist 0.1 mm 3.2 mm    Left Cephalic  Segment Diameter Depth Comment  Axilla 3.8 mm 21 mm   1. Prox 3.8 mm 19 mm   2. Mid upper arm 3.8 mm 14 mm   3. Above AC 4.3 mm 14 mm   4. In AC 4.8 mm 8.4 mm   5. Below AC 3.2 mm 9.3 mm   6. Mid forearm 3.3 mm 5.0 mm   7. Wrist 1.5 mm 3.8 mm        ASSESSMENT:  ESRD  AKI on CKD   PLAN: She has acceptable Cephalic veins.  She is right hand dominant, so we will plan left UE brachial cephalic av fistula creation later this week.  She is in agreement to proceed.  I will place an order in the chart for consent.     Mosetta Pigeon PA-C [facility] [phone]  I have independently interviewed and examined patient and agree with PA assessment and plan above. Will plan left arm av fistula in near future.   Judith Demps C. Randie Heinz, MD [facility] [place] Office: [phone] Pager: [phone]

## 2018-02-15 ENCOUNTER — Other Ambulatory Visit (HOSPITAL_COMMUNITY): Payer: Self-pay

## 2018-02-15 DIAGNOSIS — J80 Acute respiratory distress syndrome: Secondary | ICD-10-CM | POA: Diagnosis not present

## 2018-02-15 DIAGNOSIS — I5021 Acute systolic (congestive) heart failure: Secondary | ICD-10-CM | POA: Diagnosis not present

## 2018-02-15 DIAGNOSIS — J9621 Acute and chronic respiratory failure with hypoxia: Secondary | ICD-10-CM | POA: Diagnosis not present

## 2018-02-15 DIAGNOSIS — N170 Acute kidney failure with tubular necrosis: Secondary | ICD-10-CM | POA: Diagnosis not present

## 2018-02-15 MED ORDER — CLINDAMYCIN PHOSPHATE 600 MG/50ML IV SOLN
600.0000 mg | INTRAVENOUS | Status: AC
  Filled 2018-02-15: qty 50

## 2018-02-15 NOTE — Progress Notes (Signed)
Pulmonary Critical Care Medicine [facility] [place]   PULMONARY SERVICE  PROGRESS NOTE  Date of Service: 02/15/2018  Terri Ellis  ZOX:[id number]  DOB: May 03, 1956   DOA: 01/24/2018  Referring Physician: Carron Curie, MD  HPI: Terri Ellis is a 62 y.o. female seen for follow up of Acute on Chronic Respiratory Failure.  Patient is capping has been capped for more than 72 hours looks good right now.  Hopefully we should be able to move towards decannulation.  Medications: Reviewed on Rounds  Physical Exam:  Vitals: Temperature 97.4 pulse 90 respiratory rate 20 blood pressure 100/57 saturations 96%  Ventilator Settings capping off the ventilator  . General: Comfortable at this time . Eyes: Grossly normal lids, irises & conjunctiva . ENT: grossly tongue is normal . Neck: no obvious mass . Cardiovascular: S1 S2 normal no gallop . Respiratory: No rhonchi expansion is equal . Abdomen: soft . Skin: no rash seen on limited exam . Musculoskeletal: not rigid . Psychiatric:unable to assess . Neurologic: no seizure no involuntary movements         Labs on Admission:  Basic Metabolic Panel: Recent Labs  Lab 02/09/18 0637 02/10/18 0701 02/12/18 0715 02/14/18 0719  NA 136 136 134* 133*  K 3.9 4.0 4.6 4.2  CL 96* 96* 93* 92*  CO2 GLUCOSE 113* 131* 124* 117*  BUN 51* 80* 77* 87*  CREATININE 3.29* 4.06* 3.61* 3.64*  CALCIUM 8.9 9.2 9.6 9.3  PHOS  --  4.0 5.2*  --     Liver Function Tests: Recent Labs  Lab 02/09/18 0637 02/10/18 0701 02/12/18 0715 02/14/18 0719  AST 250* 118*  --  188*  ALT 241* 198*  --  225*  ALKPHOS 879* 817*  --  713*  BILITOT 7.6* 5.9*  --  6.7*  PROT 6.3* 6.3*  --  6.4*  ALBUMIN 2.6* 2.6* 2.8* 2.7*   No results for input(s): LIPASE, AMYLASE in the last 168 hours. No results for input(s): AMMONIA in the last 168 hours.  CBC: Recent Labs  Lab 02/10/18 0902 02/12/18 0715  WBC 17.8* 14.8*  HGB  6.3* 6.5*  HCT 20.8* 20.4*  MCV 103.0* 100.5*  PLT 187 173    Cardiac Enzymes: No results for input(s): CKTOTAL, CKMB, CKMBINDEX, TROPONINI in the last 168 hours.  BNP (last 3 results) No results for input(s): BNP in the last 8760 hours.  ProBNP (last 3 results) No results for input(s): PROBNP in the last 8760 hours.  Radiological Exams on Admission: No results found.  Assessment/Plan Principal Problem:   Acute on chronic respiratory failure with hypoxia (HCC) Active Problems:   Acute tubular necrosis (HCC)   Lobar pneumonia (HCC)   Severe sepsis with septic shock (HCC)   ARDS (adult respiratory distress syndrome) (HCC)   Acute systolic CHF (congestive heart failure) (HCC)   1. Acute on chronic respiratory failure with hypoxia continue with capping trials and advance hopefully towards decannulation. 2. Acute tubular necrosis followed by nephrology patient to have a AV fistula done. 3. Lobar pneumonia treated with antibiotics we will continue to monitor 4. ARDS clinically resolved 5. Severe sepsis Resolved 6. Acute systolic heart failure we will continue with present management continue with dialysis   I have personally seen and evaluated the patient, evaluated laboratory and imaging results, formulated the assessment and plan and placed orders. The Patient requires high complexity decision making for assessment and support.  Case was discussed on Rounds with the Respiratory  Therapy Staff  Allyne Gee, MD [facility] Pulmonary Critical Care Medicine Sleep Medicine

## 2018-02-15 NOTE — Progress Notes (Signed)
  Progress Note    Plan will be for left arm AV fistula versus graft tomorrow in the operating room.  She needs to be n.p.o. past midnight.  Please consent for the above-noted procedure.  Pookela Sellin C. Randie Heinz, MD [facility] [place] Office: [phone] Pager: [phone]  02/15/2018 11:09 AM

## 2018-02-16 ENCOUNTER — Encounter
Admission: RE | Disposition: A | Discharge status: Skilled Nursing Facility | Payer: Self-pay | Attending: Internal Medicine

## 2018-02-16 ENCOUNTER — Encounter (HOSPITAL_COMMUNITY): Payer: Self-pay | Admitting: Anesthesiology

## 2018-02-16 DIAGNOSIS — A419 Sepsis, unspecified organism: Secondary | ICD-10-CM | POA: Diagnosis not present

## 2018-02-16 DIAGNOSIS — N170 Acute kidney failure with tubular necrosis: Secondary | ICD-10-CM | POA: Diagnosis not present

## 2018-02-16 DIAGNOSIS — J9621 Acute and chronic respiratory failure with hypoxia: Secondary | ICD-10-CM | POA: Diagnosis not present

## 2018-02-16 DIAGNOSIS — R6521 Severe sepsis with septic shock: Secondary | ICD-10-CM | POA: Diagnosis not present

## 2018-02-16 DIAGNOSIS — I5021 Acute systolic (congestive) heart failure: Secondary | ICD-10-CM | POA: Diagnosis not present

## 2018-02-16 DIAGNOSIS — J80 Acute respiratory distress syndrome: Secondary | ICD-10-CM | POA: Diagnosis not present

## 2018-02-16 LAB — CBC
HEMATOCRIT: 17.4 % — AB (ref 36.0–46.0)
HEMOGLOBIN: 5.4 g/dL — AB (ref 12.0–15.0)
MCH: 30.7 pg (ref 26.0–34.0)
MCHC: 31 g/dL (ref 30.0–36.0)
MCV: 98.9 fL (ref 78.0–100.0)
Platelets: 187 10*3/uL (ref 150–400)
RBC: 1.76 MIL/uL — ABNORMAL LOW (ref 3.87–5.11)
RDW: 22.8 % — AB (ref 11.5–15.5)
WBC: 12.1 10*3/uL — ABNORMAL HIGH (ref 4.0–10.5)

## 2018-02-16 LAB — RENAL FUNCTION PANEL
ALBUMIN: 2.7 g/dL — AB (ref 3.5–5.0)
Anion gap: 16 — ABNORMAL HIGH (ref 5–15)
BUN: 92 mg/dL — AB (ref 6–20)
CALCIUM: 9.2 mg/dL (ref 8.9–10.3)
CO2: 24 mmol/L (ref 22–32)
Chloride: 95 mmol/L — ABNORMAL LOW (ref 101–111)
Creatinine, Ser: 3.2 mg/dL — ABNORMAL HIGH (ref 0.44–1.00)
GFR calc Af Amer: 17 mL/min — ABNORMAL LOW (ref >60)
GFR calc non Af Amer: 14 mL/min — ABNORMAL LOW (ref >60)
GLUCOSE: 94 mg/dL (ref 65–99)
PHOSPHORUS: 4.5 mg/dL (ref 2.5–4.6)
POTASSIUM: 4.2 mmol/L (ref 3.5–5.1)
SODIUM: 135 mmol/L (ref 135–145)

## 2018-02-16 LAB — PREPARE RBC (CROSSMATCH)

## 2018-02-16 SURGERY — ARTERIOVENOUS (AV) FISTULA CREATION
Anesthesia: Monitor Anesthesia Care | Laterality: Left

## 2018-02-16 MED ORDER — SODIUM CHLORIDE 0.9 % IV SOLN: 10.0000 mL/h | Freq: Once | INTRAVENOUS | Status: DC

## 2018-02-16 NOTE — Progress Notes (Signed)
Pulmonary Critical Care Medicine [facility] [place]   PULMONARY SERVICE  PROGRESS NOTE  Date of Service: 02/16/2018  Shawanda Sievert  BJY:[id number]  DOB: Feb 11, 1956   DOA: 01/24/2018  Referring Physician: Carron Curie, MD  HPI: Terri Ellis is a 62 y.o. female seen for follow up of Acute on Chronic Respiratory Failure.  She is doing well has been capping no distress.  Patient is scheduled for several procedures prior to decannulation.  Right now she is comfortable had a swallow study done which was also normal  Medications: Reviewed on Rounds  Physical Exam:  Vitals: Temperature 97.8 pulse 86 respiratory rate 16 blood pressure 105/63 saturation 94%  Ventilator Settings capped off the ventilator at this time  . General: Comfortable at this time . Eyes: Grossly normal lids, irises & conjunctiva . ENT: grossly tongue is normal . Neck: no obvious mass . Cardiovascular: S1 S2 normal no gallop . Respiratory: No rhonchi expansion is equal . Abdomen: soft . Skin: no rash seen on limited exam . Musculoskeletal: not rigid . Psychiatric:unable to assess . Neurologic: no seizure no involuntary movements         Labs on Admission:  Basic Metabolic Panel: Recent Labs  Lab 02/10/18 0701 02/12/18 0715 02/14/18 0719 02/16/18 0540  NA 136 134* 133* 135  K 4.0 4.6 4.2 4.2  CL 96* 93* 92* 95*  CO2 GLUCOSE 131* 124* 117* 94  BUN 80* 77* 87* 92*  CREATININE 4.06* 3.61* 3.64* 3.20*  CALCIUM 9.2 9.6 9.3 9.2  PHOS 4.0 5.2*  --  4.5    Liver Function Tests: Recent Labs  Lab 02/10/18 0701 02/12/18 0715 02/14/18 0719 02/16/18 0540  AST 118*  --  188*  --   ALT 198*  --  225*  --   ALKPHOS 817*  --  713*  --   BILITOT 5.9*  --  6.7*  --   PROT 6.3*  --  6.4*  --   ALBUMIN 2.6* 2.8* 2.7* 2.7*   No results for input(s): LIPASE, AMYLASE in the last 168 hours. No results for input(s): AMMONIA in the last 168 hours.  CBC: Recent Labs   Lab 02/10/18 0902 02/12/18 0715 02/16/18 0540  WBC 17.8* 14.8* 12.1*  HGB 6.3* 6.5* 5.4*  HCT 20.8* 20.4* 17.4*  MCV 103.0* 100.5* 98.9  PLT 187 173 187    Cardiac Enzymes: No results for input(s): CKTOTAL, CKMB, CKMBINDEX, TROPONINI in the last 168 hours.  BNP (last 3 results) No results for input(s): BNP in the last 8760 hours.  ProBNP (last 3 results) No results for input(s): PROBNP in the last 8760 hours.  Radiological Exams on Admission: No results found.  Assessment/Plan Principal Problem:   Acute on chronic respiratory failure with hypoxia (HCC) Active Problems:   Acute tubular necrosis (HCC)   Lobar pneumonia (HCC)   Severe sepsis with septic shock (HCC)   ARDS (adult respiratory distress syndrome) (HCC)   Acute systolic CHF (congestive heart failure) (HCC)   1. Acute on chronic respiratory failure with hypoxia we will continue with capping trials as ordered continue pulmonary toilet secretion management supportive care working towards decannulation 2. Acute tubular necrosis followed by nephrology for dialysis 3. Lobar pneumonia treated with antibiotics resolved 4. Severe sepsis clinically improved hemodynamically stable 5. ARDS clinically resolving 6. Acute systolic heart failure compensated we will continue to monitor fluid status closely   I have personally seen and evaluated the patient, evaluated laboratory and  imaging results, formulated the assessment and plan and placed orders. The Patient requires high complexity decision making for assessment and support.  Case was discussed on Rounds with the Respiratory Therapy Staff  Allyne Gee, MD [facility] Pulmonary Critical Care Medicine Sleep Medicine

## 2018-02-16 NOTE — Progress Notes (Signed)
Patient ID: Terri Ellis, female   DOB: 08-27-1956, 62 y.o.   MRN: [id number] The patient had been scheduled for arm access today.  Unfortunately was not communicated and the patient was on hemodialysis and will come off later this afternoon.  Will reschedule on a nondialysis day.  Has functioning tunneled dialysis catheter

## 2018-02-16 NOTE — Progress Notes (Signed)
[facility]  ROUNDING NOTE   Subjective:  Patient underwent hemodialysis today. She received 2 units of PRBC transfusion. Gastroenterology has been consulted.   Objective:  Vital signs in last 24 hours:  Pulse 97 respirations 83 respirations 26 blood pressure 116/53  Physical Exam: General: Chronically ill appearing  Head: Normocephalic, atraumatic. Moist oral mucosal membranes  Eyes: Anicteric  Neck: Tracheostomy in place, capped  Lungs:  Bilateral rhonchi, normal effort  Heart: S1S2 no rubs  Abdomen:  Soft, nontender, PEG  Extremities: 1+ peripheral edema.  Neurologic: Awake, alert, follows simple commands  Skin: No lesions  Access: Right internal jugular PermCath    Basic Metabolic Panel: Recent Labs  Lab 02/10/18 0701 02/12/18 0715 02/14/18 0719 02/16/18 0540  NA 136 134* 133* 135  K 4.0 4.6 4.2 4.2  CL 96* 93* 92* 95*  CO2 GLUCOSE 131* 124* 117* 94  BUN 80* 77* 87* 92*  CREATININE 4.06* 3.61* 3.64* 3.20*  CALCIUM 9.2 9.6 9.3 9.2  PHOS 4.0 5.2*  --  4.5    Liver Function Tests: Recent Labs  Lab 02/10/18 0701 02/12/18 0715 02/14/18 0719 02/16/18 0540  AST 118*  --  188*  --   ALT 198*  --  225*  --   ALKPHOS 817*  --  713*  --   BILITOT 5.9*  --  6.7*  --   PROT 6.3*  --  6.4*  --   ALBUMIN 2.6* 2.8* 2.7* 2.7*   No results for input(s): LIPASE, AMYLASE in the last 168 hours. No results for input(s): AMMONIA in the last 168 hours.  CBC: Recent Labs  Lab 02/10/18 0902 02/12/18 0715 02/16/18 0540  WBC 17.8* 14.8* 12.1*  HGB 6.3* 6.5* 5.4*  HCT 20.8* 20.4* 17.4*  MCV 103.0* 100.5* 98.9  PLT 187 173 187    Cardiac Enzymes: No results for input(s): CKTOTAL, CKMB, CKMBINDEX, TROPONINI in the last 168 hours.  BNP: Invalid input(s): POCBNP  CBG: No results for input(s): GLUCAP in the last 168 hours.  Microbiology: Results for orders placed or performed during the hospital encounter of 01/24/18  Culture, Urine      Status: None   Collection Time: 01/28/18  3:00 AM  Result Value Ref Range Status   Specimen Description URINE, RANDOM  Final   Special Requests NONE  Final   Culture   Final    NO GROWTH Performed at [facility], [address], [place], [place] [address]    Report Status 01/29/2018 FINAL  Final    Coagulation Studies: No results for input(s): LABPROT, INR in the last 72 hours.  Urinalysis: No results for input(s): COLORURINE, LABSPEC, PHURINE, GLUCOSEU, HGBUR, BILIRUBINUR, KETONESUR, PROTEINUR, UROBILINOGEN, NITRITE, LEUKOCYTESUR in the last 72 hours.  Invalid input(s): APPERANCEUR    Imaging: No results found.   Medications:       Assessment/ Plan:  62 y.o. female with a PMHx of congestive heart failure, chronic kidney disease stage III, acute respiratory failure status post tracheostomy placement, hypertension, iron deficiency anemia, diabetes mellitus type 2, hyperlipidemia, obesity, who was admitted to [facility]24[facility]2018 for ongoing treatment of pneumonia, acute respiratory failure, acute renal failure requiring hemodialysis.  1.  Acute renal failure/chronic kidney disease stage III baseline creatinine 1.8.  Patient was seen by nephrology at the outside hospital.  She was initially started on CRRT and subsequently transitioned to intermittent hemodialysis. -Azotemia persist despite ongoing dialysis.  This does suggest underlying GI bleed  as her hemoglobin is dropping.  She did undergo dialysis today and received 2 units PRBC transfusion.  Next dialysis for Friday.  2.  Acute respiratory failure.  The patient's tracheostomy remains capped and patient appears to be breathing comfortably.  3.  Anemia of chronic kidney disease.  Hemoglobin was down to 5.4 today.  The patient was transfused 2 units PRBCs during dialysis.  Gastroenterology consultation to be also undertaken.  4.  Secondary hyperparathyroidism.  Phosphorus currently 4.5 and acceptable.   Continue to periodically monitor.  5.  Hyperkalemia.  Potassium remains normal at 4.2.     LOS: 0 Terri Ellis 5/22/20193:59 PM

## 2018-02-17 ENCOUNTER — Encounter
Admission: RE | Disposition: A | Discharge status: Skilled Nursing Facility | Payer: Self-pay | Attending: Internal Medicine

## 2018-02-17 DIAGNOSIS — J80 Acute respiratory distress syndrome: Secondary | ICD-10-CM | POA: Diagnosis not present

## 2018-02-17 DIAGNOSIS — J9621 Acute and chronic respiratory failure with hypoxia: Secondary | ICD-10-CM | POA: Diagnosis not present

## 2018-02-17 DIAGNOSIS — I5021 Acute systolic (congestive) heart failure: Secondary | ICD-10-CM | POA: Diagnosis not present

## 2018-02-17 DIAGNOSIS — N170 Acute kidney failure with tubular necrosis: Secondary | ICD-10-CM | POA: Diagnosis not present

## 2018-02-17 LAB — COMPREHENSIVE METABOLIC PANEL
ALK PHOS: 735 U/L — AB (ref 38–126)
ALT: 265 U/L — AB (ref 14–54)
ANION GAP: 13 (ref 5–15)
AST: 193 U/L — ABNORMAL HIGH (ref 15–41)
Albumin: 2.6 g/dL — ABNORMAL LOW (ref 3.5–5.0)
BILIRUBIN TOTAL: 7.3 mg/dL — AB (ref 0.3–1.2)
BUN: 59 mg/dL — AB (ref 6–20)
CALCIUM: 8.9 mg/dL (ref 8.9–10.3)
CO2: 23 mmol/L (ref 22–32)
CREATININE: 2.55 mg/dL — AB (ref 0.44–1.00)
Chloride: 99 mmol/L — ABNORMAL LOW (ref 101–111)
GFR calc Af Amer: 22 mL/min — ABNORMAL LOW (ref >60)
GFR calc non Af Amer: 19 mL/min — ABNORMAL LOW (ref >60)
Glucose, Bld: 165 mg/dL — ABNORMAL HIGH (ref 65–99)
Potassium: 3.9 mmol/L (ref 3.5–5.1)
Sodium: 135 mmol/L (ref 135–145)
TOTAL PROTEIN: 6 g/dL — AB (ref 6.5–8.1)

## 2018-02-17 LAB — CBC
HEMATOCRIT: 22.6 % — AB (ref 36.0–46.0)
HEMOGLOBIN: 7.1 g/dL — AB (ref 12.0–15.0)
MCH: 29.7 pg (ref 26.0–34.0)
MCHC: 31.4 g/dL (ref 30.0–36.0)
MCV: 94.6 fL (ref 78.0–100.0)
Platelets: 205 10*3/uL (ref 150–400)
RBC: 2.39 MIL/uL — AB (ref 3.87–5.11)
RDW: 22.2 % — ABNORMAL HIGH (ref 11.5–15.5)
WBC: 10.6 10*3/uL — ABNORMAL HIGH (ref 4.0–10.5)

## 2018-02-17 LAB — OCCULT BLOOD X 1 CARD TO LAB, STOOL: FECAL OCCULT BLD: POSITIVE — AB

## 2018-02-17 SURGERY — ARTERIOVENOUS (AV) FISTULA CREATION
Anesthesia: Monitor Anesthesia Care | Laterality: Left

## 2018-02-17 NOTE — Progress Notes (Signed)
   Patient rescheduled for Tuesday for left arm avf vs graft with Dr. Dickson. NPO and hold tube feeds past midnight on Monday.   Fritzi Scripter C. Ragan Reale, MD [facility] [place] Office: [phone] Pager: [phone]  

## 2018-02-17 NOTE — Progress Notes (Signed)
Pulmonary Critical Care Medicine [facility] [place]   PULMONARY SERVICE  PROGRESS NOTE  Date of Service: 02/17/2018  Terri Ellis  JXB:[id number]  DOB: 1956-06-22   DOA: 01/24/2018  Referring Physician: Carron Curie, MD  HPI: Terri Ellis is a 62 y.o. female seen for follow up of Acute on Chronic Respiratory Failure.  She is doing fairly well has been capping also has been eating waiting for her surgical procedures prior to decannulation.  Medications: Reviewed on Rounds  Physical Exam:  Vitals: Temperature 97.6 pulse 93 respiratory rate 25 blood pressure 116/68 saturations 96%  Ventilator Settings Off the ventilator  . General: Comfortable at this time . Eyes: Grossly normal lids, irises & conjunctiva . ENT: grossly tongue is normal . Neck: no obvious mass . Cardiovascular: S1 S2 normal no gallop . Respiratory: No rhonchi are noted expansion is equal . Abdomen: soft . Skin: no rash seen on limited exam . Musculoskeletal: not rigid . Psychiatric:unable to assess . Neurologic: no seizure no involuntary movements         Labs on Admission:  Basic Metabolic Panel: Recent Labs  Lab 02/12/18 0715 02/14/18 0719 02/16/18 0540 02/17/18 1108  NA 134* 133* 135 135  K 4.6 4.2 4.2 3.9  CL 93* 92* 95* 99*  CO2 GLUCOSE 124* 117* 94 165*  BUN 77* 87* 92* 59*  CREATININE 3.61* 3.64* 3.20* 2.55*  CALCIUM 9.6 9.3 9.2 8.9  PHOS 5.2*  --  4.5  --     Liver Function Tests: Recent Labs  Lab 02/12/18 0715 02/14/18 0719 02/16/18 0540 02/17/18 1108  AST  --  188*  --  193*  ALT  --  225*  --  265*  ALKPHOS  --  713*  --  735*  BILITOT  --  6.7*  --  7.3*  PROT  --  6.4*  --  6.0*  ALBUMIN 2.8* 2.7* 2.7* 2.6*   No results for input(s): LIPASE, AMYLASE in the last 168 hours. No results for input(s): AMMONIA in the last 168 hours.  CBC: Recent Labs  Lab 02/12/18 0715 02/16/18 0540 02/17/18 1108  WBC 14.8* 12.1* 10.6*   HGB 6.5* 5.4* 7.1*  HCT 20.4* 17.4* 22.6*  MCV 100.5* 98.9 94.6  PLT 173 187 205    Cardiac Enzymes: No results for input(s): CKTOTAL, CKMB, CKMBINDEX, TROPONINI in the last 168 hours.  BNP (last 3 results) No results for input(s): BNP in the last 8760 hours.  ProBNP (last 3 results) No results for input(s): PROBNP in the last 8760 hours.  Radiological Exams on Admission: No results found.  Assessment/Plan Principal Problem:   Acute on chronic respiratory failure with hypoxia (HCC) Active Problems:   Acute tubular necrosis (HCC)   Lobar pneumonia (HCC)   Severe sepsis with septic shock (HCC)   ARDS (adult respiratory distress syndrome) (HCC)   Acute systolic CHF (congestive heart failure) (HCC)   1. Acute on chronic respiratory failure with hypoxia continue with capping as ordered.  Secretions are fair to moderate.  Continue with aggressive toilet. 2. Acute renal failure patient is compensated we will continue supportive care patient is followed by nephrology for dialysis ongoing. 3. Lobar pneumonia treated with antibiotics. 4. Severe sepsis treated clinically doing better 5. ARDS resolving systolic heart failure compensated at this time.   I have personally seen and evaluated the patient, evaluated laboratory and imaging results, formulated the assessment and plan and placed orders. The Patient requires high  complexity decision making for assessment and support.  Case was discussed on Rounds with the Respiratory Therapy Staff  Allyne Gee, MD [facility] Pulmonary Critical Care Medicine Sleep Medicine

## 2018-02-17 NOTE — H&P (View-Only) (Signed)
   Patient rescheduled for Tuesday for left arm avf vs graft with Dr. Edilia Bo. NPO and hold tube feeds past midnight on Monday.   Ernie Sagrero C. Randie Heinz, MD [facility] [place] Office: [phone] Pager: [phone]

## 2018-02-18 DIAGNOSIS — I5021 Acute systolic (congestive) heart failure: Secondary | ICD-10-CM | POA: Diagnosis not present

## 2018-02-18 DIAGNOSIS — J80 Acute respiratory distress syndrome: Secondary | ICD-10-CM | POA: Diagnosis not present

## 2018-02-18 DIAGNOSIS — J9621 Acute and chronic respiratory failure with hypoxia: Secondary | ICD-10-CM | POA: Diagnosis not present

## 2018-02-18 DIAGNOSIS — A419 Sepsis, unspecified organism: Secondary | ICD-10-CM | POA: Diagnosis not present

## 2018-02-18 DIAGNOSIS — N170 Acute kidney failure with tubular necrosis: Secondary | ICD-10-CM | POA: Diagnosis not present

## 2018-02-18 LAB — CBC
HEMATOCRIT: 20.4 % — AB (ref 36.0–46.0)
HEMOGLOBIN: 6.7 g/dL — AB (ref 12.0–15.0)
MCH: 30.7 pg (ref 26.0–34.0)
MCHC: 32.8 g/dL (ref 30.0–36.0)
MCV: 93.6 fL (ref 78.0–100.0)
Platelets: 191 10*3/uL (ref 150–400)
RBC: 2.18 MIL/uL — AB (ref 3.87–5.11)
RDW: 21.9 % — ABNORMAL HIGH (ref 11.5–15.5)
WBC: 12.4 10*3/uL — AB (ref 4.0–10.5)

## 2018-02-18 LAB — PREPARE RBC (CROSSMATCH)

## 2018-02-18 LAB — RENAL FUNCTION PANEL
Albumin: 2.6 g/dL — ABNORMAL LOW (ref 3.5–5.0)
Anion gap: 17 — ABNORMAL HIGH (ref 5–15)
BUN: 92 mg/dL — ABNORMAL HIGH (ref 6–20)
CHLORIDE: 96 mmol/L — AB (ref 101–111)
CO2: 21 mmol/L — AB (ref 22–32)
Calcium: 9 mg/dL (ref 8.9–10.3)
Creatinine, Ser: 3.11 mg/dL — ABNORMAL HIGH (ref 0.44–1.00)
GFR, EST AFRICAN AMERICAN: 17 mL/min — AB (ref >60)
GFR, EST NON AFRICAN AMERICAN: 15 mL/min — AB (ref >60)
Glucose, Bld: 122 mg/dL — ABNORMAL HIGH (ref 65–99)
POTASSIUM: 4.3 mmol/L (ref 3.5–5.1)
Phosphorus: 5.5 mg/dL — ABNORMAL HIGH (ref 2.5–4.6)
Sodium: 134 mmol/L — ABNORMAL LOW (ref 135–145)

## 2018-02-18 NOTE — Progress Notes (Signed)
Pulmonary Critical Care Medicine [facility] [place]   PULMONARY SERVICE  PROGRESS NOTE  Date of Service: 02/18/2018  Terri Ellis  NWG:[id number]  DOB: [birth year]   DOA: 01/24/2018  Referring Physician: Carron Curie, MD  HPI: Terri Ellis is a 62 y.o. female seen for follow up of Acute on Chronic Respiratory Failure.  Patient is doing fine with capping trials still waiting on decannulation because of scheduled procedures.  Right now is comfortable  Medications: Reviewed on Rounds  Physical Exam:  Vitals: Temperature 97.0 pulse 73 respiratory rate 20 blood pressure 126/72 saturations 95%  Ventilator Settings capping will be continued right now is on 3 L  . General: Comfortable at this time . Eyes: Grossly normal lids, irises & conjunctiva . ENT: grossly tongue is normal . Neck: no obvious mass . Cardiovascular: S1 S2 normal no gallop . Respiratory: No rhonchi expansion is equal . Abdomen: soft . Skin: no rash seen on limited exam . Musculoskeletal: not rigid . Psychiatric:unable to assess . Neurologic: no seizure no involuntary movements         Labs on Admission:  Basic Metabolic Panel: Recent Labs  Lab 02/12/18 0715 02/14/18 0719 02/16/18 0540 02/17/18 1108 02/18/18 0643  NA 134* 133* 135 135 134*  K 4.6 4.2 4.2 3.9 4.3  CL 93* 92* 95* 99* 96*  CO2 21*  GLUCOSE 124* 117* 94 165* 122*  BUN 77* 87* 92* 59* 92*  CREATININE 3.61* 3.64* 3.20* 2.55* 3.11*  CALCIUM 9.6 9.3 9.2 8.9 9.0  PHOS 5.2*  --  4.5  --  5.5*    Liver Function Tests: Recent Labs  Lab 02/12/18 0715 02/14/18 0719 02/16/18 0540 02/17/18 1108 02/18/18 0643  AST  --  188*  --  193*  --   ALT  --  225*  --  265*  --   ALKPHOS  --  713*  --  735*  --   BILITOT  --  6.7*  --  7.3*  --   PROT  --  6.4*  --  6.0*  --   ALBUMIN 2.8* 2.7* 2.7* 2.6* 2.6*   No results for input(s): LIPASE, AMYLASE in the last 168 hours. No results for input(s):  AMMONIA in the last 168 hours.  CBC: Recent Labs  Lab 02/12/18 0715 02/16/18 0540 02/17/18 1108 02/18/18 0824  WBC 14.8* 12.1* 10.6* 12.4*  HGB 6.5* 5.4* 7.1* 6.7*  HCT 20.4* 17.4* 22.6* 20.4*  MCV 100.5* 98.9 94.6 93.6  PLT 173 187 205 191    Cardiac Enzymes: No results for input(s): CKTOTAL, CKMB, CKMBINDEX, TROPONINI in the last 168 hours.  BNP (last 3 results) No results for input(s): BNP in the last 8760 hours.  ProBNP (last 3 results) No results for input(s): PROBNP in the last 8760 hours.  Radiological Exams on Admission: No results found.  Assessment/Plan Principal Problem:   Acute on chronic respiratory failure with hypoxia (HCC) Active Problems:   Acute tubular necrosis (HCC)   Lobar pneumonia (HCC)   Severe sepsis with septic shock (HCC)   ARDS (adult respiratory distress syndrome) (HCC)   Acute systolic CHF (congestive heart failure) (HCC)   1. Acute on chronic respiratory failure with hypoxia continue with capping hopefully we should be able to decannulate her next week. 2. Lobar pneumonia treated clinically resolved 3. ARDS clinically improved and resolved 4. Acute systolic heart failure continue with supportive care right now is compensated 5. Acute renal failure followed by nephrology  for dialysis 6. Severe sepsis clinically resolved   I have personally seen and evaluated the patient, evaluated laboratory and imaging results, formulated the assessment and plan and placed orders. The Patient requires high complexity decision making for assessment and support.  Case was discussed on Rounds with the Respiratory Therapy Staff  Yevonne Pax, MD [facility] Pulmonary Critical Care Medicine Sleep Medicine

## 2018-02-18 NOTE — Progress Notes (Signed)
[facility]  ROUNDING NOTE   Subjective:  Patient resting comfortably. Tracheostomy remains capped. Patient is able to verbalize. She does report of bilateral lower extremity pain.   Objective:  Vital signs in last 24 hours:  Temperature 97 pulse 73 respirations 20 blood pressure 126/72  Physical Exam: General: Chronically ill appearing  Head: Normocephalic, atraumatic. Moist oral mucosal membranes  Eyes: Anicteric  Neck: Tracheostomy in place, capped  Lungs:  Bilateral rhonchi, normal effort  Heart: S1S2 no rubs  Abdomen:  Soft, nontender, PEG  Extremities: 1+ peripheral edema.  Neurologic: Awake, alert, follows simple commands  Skin: No lesions  Access: Right internal jugular PermCath    Basic Metabolic Panel: Recent Labs  Lab 02/12/18 0715 02/14/18 0719 02/16/18 0540 02/17/18 1108 02/18/18 0643  NA 134* 133* 135 135 134*  K 4.6 4.2 4.2 3.9 4.3  CL 93* 92* 95* 99* 96*  CO2 21*  GLUCOSE 124* 117* 94 165* 122*  BUN 77* 87* 92* 59* 92*  CREATININE 3.61* 3.64* 3.20* 2.55* 3.11*  CALCIUM 9.6 9.3 9.2 8.9 9.0  PHOS 5.2*  --  4.5  --  5.5*    Liver Function Tests: Recent Labs  Lab 02/12/18 0715 02/14/18 0719 02/16/18 0540 02/17/18 1108 02/18/18 0643  AST  --  188*  --  193*  --   ALT  --  225*  --  265*  --   ALKPHOS  --  713*  --  735*  --   BILITOT  --  6.7*  --  7.3*  --   PROT  --  6.4*  --  6.0*  --   ALBUMIN 2.8* 2.7* 2.7* 2.6* 2.6*   No results for input(s): LIPASE, AMYLASE in the last 168 hours. No results for input(s): AMMONIA in the last 168 hours.  CBC: Recent Labs  Lab 02/12/18 0715 02/16/18 0540 02/17/18 1108  WBC 14.8* 12.1* 10.6*  HGB 6.5* 5.4* 7.1*  HCT 20.4* 17.4* 22.6*  MCV 100.5* 98.9 94.6  PLT 173 187 205    Cardiac Enzymes: No results for input(s): CKTOTAL, CKMB, CKMBINDEX, TROPONINI in the last 168 hours.  BNP: Invalid input(s): POCBNP  CBG: No results for input(s): GLUCAP in the last 168  hours.  Microbiology: Results for orders placed or performed during the hospital encounter of 01/24/18  Culture, Urine     Status: None   Collection Time: 01/28/18  3:00 AM  Result Value Ref Range Status   Specimen Description URINE, RANDOM  Final   Special Requests NONE  Final   Culture   Final    NO GROWTH Performed at [facility], [address], [place], [place] [address]    Report Status 01/29/2018 FINAL  Final    Coagulation Studies: No results for input(s): LABPROT, INR in the last 72 hours.  Urinalysis: No results for input(s): COLORURINE, LABSPEC, PHURINE, GLUCOSEU, HGBUR, BILIRUBINUR, KETONESUR, PROTEINUR, UROBILINOGEN, NITRITE, LEUKOCYTESUR in the last 72 hours.  Invalid input(s): APPERANCEUR    Imaging: No results found.   Medications:       Assessment/ Plan:  62 y.o. female with a PMHx of congestive heart failure, chronic kidney disease stage III, acute respiratory failure status post tracheostomy placement, hypertension, iron deficiency anemia, diabetes mellitus type 2, hyperlipidemia, obesity, who was admitted to [facility] on 01/24/2018 for ongoing treatment of pneumonia, acute respiratory failure, acute renal failure requiring hemodialysis.  1.  Acute renal failure/chronic kidney disease stage III baseline creatinine 1.8.  Patient was seen by nephrology at the outside hospital.  She was initially started on CRRT and subsequently transitioned to intermittent hemodialysis. -BUN high again this a.m. at 92.  We do plan for hemodialysis today.  Once dialysis completed today we will plan for dialysis again on Monday.  2.  Acute respiratory failure.  No significant change in respiratory status at the moment.  Tracheostomy remains capped at this time.  3.  Anemia of chronic kidney disease.  Hemoglobin up to 7.1 post blood transfusion.  Continue to monitor CBC closely.  4.  Secondary hyperparathyroidism.  Serum phosphorus up to 5.5 this a.m.  This  should come down with dialysis.  5.  Hyperkalemia.  Resolved for now.     LOS: 0 Ciarah Peace 5/24/20199:00 AM

## 2018-02-19 LAB — HEMOGLOBIN AND HEMATOCRIT, BLOOD
HCT: 22.4 % — ABNORMAL LOW (ref 36.0–46.0)
HEMOGLOBIN: 7.3 g/dL — AB (ref 12.0–15.0)

## 2018-02-20 LAB — TYPE AND SCREEN
ABO/RH(D): O POS
ANTIBODY SCREEN: NEGATIVE
UNIT DIVISION: 0
UNIT DIVISION: 0
UNIT DIVISION: 0
Unit division: 0
Unit division: 0
Unit division: 0

## 2018-02-20 LAB — BPAM RBC
BLOOD PRODUCT EXPIRATION DATE: 201906162359
BLOOD PRODUCT EXPIRATION DATE: 201906172359
BLOOD PRODUCT EXPIRATION DATE: 201906172359
Blood Product Expiration Date: 201906162359
Blood Product Expiration Date: 201906172359
Blood Product Expiration Date: 201906172359
ISSUE DATE / TIME: 201905221214
ISSUE DATE / TIME: 201905221214
ISSUE DATE / TIME: 201905241253
UNIT TYPE AND RH: 5100
UNIT TYPE AND RH: 5100
Unit Type and Rh: 5100
Unit Type and Rh: 5100
Unit Type and Rh: 5100
Unit Type and Rh: 5100

## 2018-02-20 LAB — CBC
HCT: 21.1 % — ABNORMAL LOW (ref 36.0–46.0)
HEMOGLOBIN: 6.8 g/dL — AB (ref 12.0–15.0)
MCH: 30.6 pg (ref 26.0–34.0)
MCHC: 32.2 g/dL (ref 30.0–36.0)
MCV: 95 fL (ref 78.0–100.0)
Platelets: 185 10*3/uL (ref 150–400)
RBC: 2.22 MIL/uL — AB (ref 3.87–5.11)
RDW: 21.8 % — ABNORMAL HIGH (ref 11.5–15.5)
WBC: 11 10*3/uL — AB (ref 4.0–10.5)

## 2018-02-20 LAB — PREPARE RBC (CROSSMATCH)

## 2018-02-21 DIAGNOSIS — N170 Acute kidney failure with tubular necrosis: Secondary | ICD-10-CM | POA: Diagnosis not present

## 2018-02-21 DIAGNOSIS — R6521 Severe sepsis with septic shock: Secondary | ICD-10-CM | POA: Diagnosis not present

## 2018-02-21 DIAGNOSIS — J80 Acute respiratory distress syndrome: Secondary | ICD-10-CM | POA: Diagnosis not present

## 2018-02-21 DIAGNOSIS — A419 Sepsis, unspecified organism: Secondary | ICD-10-CM | POA: Diagnosis not present

## 2018-02-21 DIAGNOSIS — I5021 Acute systolic (congestive) heart failure: Secondary | ICD-10-CM | POA: Diagnosis not present

## 2018-02-21 DIAGNOSIS — J9621 Acute and chronic respiratory failure with hypoxia: Secondary | ICD-10-CM | POA: Diagnosis not present

## 2018-02-21 LAB — RENAL FUNCTION PANEL
ALBUMIN: 2.5 g/dL — AB (ref 3.5–5.0)
ANION GAP: 15 (ref 5–15)
BUN: 120 mg/dL — ABNORMAL HIGH (ref 6–20)
CALCIUM: 9.1 mg/dL (ref 8.9–10.3)
CO2: 22 mmol/L (ref 22–32)
CREATININE: 3.33 mg/dL — AB (ref 0.44–1.00)
Chloride: 96 mmol/L — ABNORMAL LOW (ref 101–111)
GFR calc non Af Amer: 14 mL/min — ABNORMAL LOW (ref >60)
GFR, EST AFRICAN AMERICAN: 16 mL/min — AB (ref >60)
GLUCOSE: 105 mg/dL — AB (ref 65–99)
PHOSPHORUS: 6.2 mg/dL — AB (ref 2.5–4.6)
Potassium: 4.3 mmol/L (ref 3.5–5.1)
SODIUM: 133 mmol/L — AB (ref 135–145)

## 2018-02-21 LAB — CBC
HCT: 21.8 % — ABNORMAL LOW (ref 36.0–46.0)
HEMOGLOBIN: 7.1 g/dL — AB (ref 12.0–15.0)
MCH: 30.5 pg (ref 26.0–34.0)
MCHC: 32.6 g/dL (ref 30.0–36.0)
MCV: 93.6 fL (ref 78.0–100.0)
PLATELETS: 195 10*3/uL (ref 150–400)
RBC: 2.33 MIL/uL — ABNORMAL LOW (ref 3.87–5.11)
RDW: 20.5 % — AB (ref 11.5–15.5)
WBC: 12.6 10*3/uL — ABNORMAL HIGH (ref 4.0–10.5)

## 2018-02-21 NOTE — Progress Notes (Signed)
Pulmonary Critical Care Medicine [facility] [place]   PULMONARY SERVICE  PROGRESS NOTE  Date of Service: 02/21/2018  Terri Ellis  ONG:[id number]  DOB: [birth year]   DOA: 01/24/2018  Referring Physician: Carron Curie, MD  HPI: Terri Ellis is a 62 y.o. female seen for follow up of Acute on Chronic Respiratory Failure.  Patient is comfortable right now on Passy-Muir valve should be able to advance to capping as discussed on rounds with respiratory therapy this morning.  Medications: Reviewed on Rounds  Physical Exam:  Vitals: Temperature 97.6 pulse 98 respiratory rate 27 blood pressure 121/72 saturations 94%  Ventilator Settings off of the ventilator capping on 4 L nasal cannula now  . General: Comfortable at this time . Eyes: Grossly normal lids, irises & conjunctiva . ENT: grossly tongue is normal . Neck: no obvious mass . Cardiovascular: S1 S2 normal no gallop . Respiratory: Scattered rhonchi expansion is equal good air entry . Abdomen: soft . Skin: no rash seen on limited exam . Musculoskeletal: not rigid . Psychiatric:unable to assess . Neurologic: no seizure no involuntary movements         Labs on Admission:  Basic Metabolic Panel: Recent Labs  Lab 02/16/18 0540 02/17/18 1108 02/18/18 0643 02/21/18 0421  NA 135 135 134* 133*  K 4.2 3.9 4.3 4.3  CL 95* 99* 96* 96*  CO2 24 23 21* 22  GLUCOSE 94 165* 122* 105*  BUN 92* 59* 92* 120*  CREATININE 3.20* 2.55* 3.11* 3.33*  CALCIUM 9.2 8.9 9.0 9.1  PHOS 4.5  --  5.5* 6.2*    Liver Function Tests: Recent Labs  Lab 02/16/18 0540 02/17/18 1108 02/18/18 0643 02/21/18 0421  AST  --  193*  --   --   ALT  --  265*  --   --   ALKPHOS  --  735*  --   --   BILITOT  --  7.3*  --   --   PROT  --  6.0*  --   --   ALBUMIN 2.7* 2.6* 2.6* 2.5*   No results for input(s): LIPASE, AMYLASE in the last 168 hours. No results for input(s): AMMONIA in the last 168 hours.  CBC: Recent Labs   Lab 02/16/18 0540 02/17/18 1108 02/18/18 0824 02/19/18 0227 02/20/18 0527 02/21/18 0421  WBC 12.1* 10.6* 12.4*  --  11.0* 12.6*  HGB 5.4* 7.1* 6.7* 7.3* 6.8* 7.1*  HCT 17.4* 22.6* 20.4* 22.4* 21.1* 21.8*  MCV 98.9 94.6 93.6  --  95.0 93.6  PLT 187 205 191  --  185 195    Cardiac Enzymes: No results for input(s): CKTOTAL, CKMB, CKMBINDEX, TROPONINI in the last 168 hours.  BNP (last 3 results) No results for input(s): BNP in the last 8760 hours.  ProBNP (last 3 results) No results for input(s): PROBNP in the last 8760 hours.  Radiological Exams on Admission: No results found.  Assessment/Plan Principal Problem:   Acute on chronic respiratory failure with hypoxia (HCC) Active Problems:   Acute tubular necrosis (HCC)   Lobar pneumonia (HCC)   Severe sepsis with septic shock (HCC)   ARDS (adult respiratory distress syndrome) (HCC)   Acute systolic CHF (congestive heart failure) (HCC)   1. Acute on chronic respiratory failure with hypoxia we will continue with PMV and capping trials as tolerated continue secretion management pulmonary toilet.  Patient's doing better now so we should be able to advance weaning. 2. Acute renal failure followed by nephrology will continue with  supportive care dialysis as necessary. 3. Severe sepsis with shock hemodynamically stable 4. Lobar pneumonia treated with antibiotics 5. Acute systolic heart failure right now is stable we will continue present management.   I have personally seen and evaluated the patient, evaluated laboratory and imaging results, formulated the assessment and plan and placed orders. The Patient requires high complexity decision making for assessment and support.  Case was discussed on Rounds with the Respiratory Therapy Staff  Yevonne Pax, MD [facility] Pulmonary Critical Care Medicine Sleep Medicine

## 2018-02-21 NOTE — Progress Notes (Signed)
Patient made npo poast midnight in preparation for L arm AV fistula versus graft tomorrow by Dr. Edilia Bo.  She is aware of this and all questions pertaining to surgery were answered.  Consent to be obtained by nursing staff.  Emilie Rutter, PA-C [facility] [phone] 02/21/2018  1:40 PM

## 2018-02-21 NOTE — Progress Notes (Signed)
[facility]  ROUNDING NOTE   Subjective:  Patient seen and evaluated during hemodialysis. Appears to be tolerating well. Currently conversant.   Objective:  Vital signs in last 24 hours:  Temperature 97.6 pulse 72 respirations 20 blood pressure 116/67  Physical Exam: General: Chronically ill appearing  Head: Normocephalic, atraumatic. Moist oral mucosal membranes  Eyes: Anicteric  Neck: Tracheostomy in place, capped  Lungs:  Bilateral rhonchi, normal effort  Heart: S1S2 no rubs  Abdomen:  Soft, nontender, PEG  Extremities: 1+ peripheral edema.  Neurologic: Awake, alert, follows simple commands  Skin: No lesions  Access: Right internal jugular PermCath    Basic Metabolic Panel: Recent Labs  Lab 02/16/18 0540 02/17/18 1108 02/18/18 0643 02/21/18 0421  NA 135 135 134* 133*  K 4.2 3.9 4.3 4.3  CL 95* 99* 96* 96*  CO2 24 23 21* 22  GLUCOSE 94 165* 122* 105*  BUN 92* 59* 92* 120*  CREATININE 3.20* 2.55* 3.11* 3.33*  CALCIUM 9.2 8.9 9.0 9.1  PHOS 4.5  --  5.5* 6.2*    Liver Function Tests: Recent Labs  Lab 02/16/18 0540 02/17/18 1108 02/18/18 0643 02/21/18 0421  AST  --  193*  --   --   ALT  --  265*  --   --   ALKPHOS  --  735*  --   --   BILITOT  --  7.3*  --   --   PROT  --  6.0*  --   --   ALBUMIN 2.7* 2.6* 2.6* 2.5*   No results for input(s): LIPASE, AMYLASE in the last 168 hours. No results for input(s): AMMONIA in the last 168 hours.  CBC: Recent Labs  Lab 02/16/18 0540 02/17/18 1108 02/18/18 0824 02/19/18 0227 02/20/18 0527 02/21/18 0421  WBC 12.1* 10.6* 12.4*  --  11.0* 12.6*  HGB 5.4* 7.1* 6.7* 7.3* 6.8* 7.1*  HCT 17.4* 22.6* 20.4* 22.4* 21.1* 21.8*  MCV 98.9 94.6 93.6  --  95.0 93.6  PLT 187 205 191  --  185 195    Cardiac Enzymes: No results for input(s): CKTOTAL, CKMB, CKMBINDEX, TROPONINI in the last 168 hours.  BNP: Invalid input(s): POCBNP  CBG: No results for input(s): GLUCAP in the last 168  hours.  Microbiology: Results for orders placed or performed during the hospital encounter of 01/24/18  Culture, Urine     Status: None   Collection Time: 01/28/18  3:00 AM  Result Value Ref Range Status   Specimen Description URINE, RANDOM  Final   Special Requests NONE  Final   Culture   Final    NO GROWTH Performed at [facility], [address], [place], [place] [address]    Report Status 01/29/2018 FINAL  Final    Coagulation Studies: No results for input(s): LABPROT, INR in the last 72 hours.  Urinalysis: No results for input(s): COLORURINE, LABSPEC, PHURINE, GLUCOSEU, HGBUR, BILIRUBINUR, KETONESUR, PROTEINUR, UROBILINOGEN, NITRITE, LEUKOCYTESUR in the last 72 hours.  Invalid input(s): APPERANCEUR    Imaging: No results found.   Medications:       Assessment/ Plan:  62 y.o. female with a PMHx of congestive heart failure, chronic kidney disease stage III, acute respiratory failure status post tracheostomy placement, hypertension, iron deficiency anemia, diabetes mellitus type 2, hyperlipidemia, obesity, who was admitted to [facility] on 01/24/2018 for ongoing treatment of pneumonia, acute respiratory failure, acute renal failure requiring hemodialysis.  1.  Acute renal failure/chronic kidney disease stage III baseline creatinine 1.8.  Patient was seen by nephrology at the outside hospital.  She was initially started on CRRT and subsequently transitioned to intermittent hemodialysis. -Patient seen and evaluated during hemodialysis.  Appears to be tolerating well.  We plan to complete dialysis today and plan for dialysis again on Wednesday.  2.  Acute respiratory failure.  Patient continues to tolerate capped tracheostomy quite well.  3.  Anemia of chronic kidney disease.  Hemoglobin this a.m. was 7.1.  Consider blood transfusion for hemoglobin of 7 or less.  She is required multiple transfusions this admission.  4.  Secondary hyperparathyroidism.   Phosphorus high which is expected on Monday.  Reevaluate phosphorus on Monday.  5.  Hyperkalemia.  Potassium currently 4.3 and acceptable.     LOS: 0 Teven Mittman 5/27/20192:19 PM

## 2018-02-22 ENCOUNTER — Encounter
Admission: RE | Disposition: A | Discharge status: Skilled Nursing Facility | Payer: Self-pay | Attending: Internal Medicine

## 2018-02-22 ENCOUNTER — Encounter (HOSPITAL_COMMUNITY): Payer: Self-pay | Admitting: Anesthesiology

## 2018-02-22 ENCOUNTER — Telehealth: Payer: Self-pay | Admitting: Vascular Surgery

## 2018-02-22 DIAGNOSIS — Z992 Dependence on renal dialysis: Secondary | ICD-10-CM | POA: Diagnosis not present

## 2018-02-22 DIAGNOSIS — N170 Acute kidney failure with tubular necrosis: Secondary | ICD-10-CM | POA: Diagnosis not present

## 2018-02-22 DIAGNOSIS — J80 Acute respiratory distress syndrome: Secondary | ICD-10-CM | POA: Diagnosis not present

## 2018-02-22 DIAGNOSIS — N186 End stage renal disease: Secondary | ICD-10-CM | POA: Diagnosis not present

## 2018-02-22 DIAGNOSIS — J9621 Acute and chronic respiratory failure with hypoxia: Secondary | ICD-10-CM | POA: Diagnosis not present

## 2018-02-22 DIAGNOSIS — I5021 Acute systolic (congestive) heart failure: Secondary | ICD-10-CM | POA: Diagnosis not present

## 2018-02-22 HISTORY — PX: AV FISTULA PLACEMENT: SHX1204

## 2018-02-22 LAB — POCT I-STAT 4, (NA,K, GLUC, HGB,HCT)
GLUCOSE: 92 mg/dL (ref 65–99)
HCT: 20 % — ABNORMAL LOW (ref 36.0–46.0)
Hemoglobin: 6.8 g/dL — CL (ref 12.0–15.0)
Potassium: 4.1 mmol/L (ref 3.5–5.1)
Sodium: 137 mmol/L (ref 135–145)

## 2018-02-22 LAB — PREPARE RBC (CROSSMATCH)

## 2018-02-22 SURGERY — ARTERIOVENOUS (AV) FISTULA CREATION
Anesthesia: General | Site: Arm Upper | Laterality: Left

## 2018-02-22 MED ORDER — ONDANSETRON HCL 4 MG/2ML IJ SOLN: 4.0000 mg | Freq: Once | INTRAMUSCULAR | Status: DC | PRN

## 2018-02-22 MED ORDER — PROTAMINE SULFATE 10 MG/ML IV SOLN
INTRAVENOUS | Status: DC | PRN
  Administered 2018-02-22 (×4): 10 mg via INTRAVENOUS

## 2018-02-22 MED ORDER — PROPOFOL 1000 MG/100ML IV EMUL
INTRAVENOUS | Status: AC
  Filled 2018-02-22: qty 100

## 2018-02-22 MED ORDER — FENTANYL CITRATE (PF) 250 MCG/5ML IJ SOLN
INTRAMUSCULAR | Status: AC
  Filled 2018-02-22: qty 5

## 2018-02-22 MED ORDER — SODIUM CHLORIDE 0.9 % IV SOLN
INTRAVENOUS | Status: DC
  Administered 2018-02-22: 09:00:00 via INTRAVENOUS

## 2018-02-22 MED ORDER — FENTANYL CITRATE (PF) 100 MCG/2ML IJ SOLN
25.0000 ug | INTRAMUSCULAR | Status: DC | PRN
  Administered 2018-02-22: 25 ug via INTRAVENOUS

## 2018-02-22 MED ORDER — PROPOFOL 10 MG/ML IV BOLUS
INTRAVENOUS | Status: DC | PRN
  Administered 2018-02-22: 90 mg via INTRAVENOUS

## 2018-02-22 MED ORDER — LIDOCAINE HCL (CARDIAC) PF 100 MG/5ML IV SOSY
PREFILLED_SYRINGE | INTRAVENOUS | Status: DC | PRN
  Administered 2018-02-22: 40 mg via INTRATRACHEAL

## 2018-02-22 MED ORDER — SODIUM CHLORIDE 0.9 % IV SOLN
INTRAVENOUS | Status: AC
  Filled 2018-02-22: qty 1.2

## 2018-02-22 MED ORDER — MIDAZOLAM HCL 2 MG/2ML IJ SOLN
INTRAMUSCULAR | Status: AC
  Filled 2018-02-22: qty 2

## 2018-02-22 MED ORDER — PHENYLEPHRINE HCL 10 MG/ML IJ SOLN
INTRAVENOUS | Status: DC | PRN
  Administered 2018-02-22: 50 ug/min via INTRAVENOUS

## 2018-02-22 MED ORDER — FENTANYL CITRATE (PF) 250 MCG/5ML IJ SOLN
INTRAMUSCULAR | Status: DC | PRN
  Administered 2018-02-22: 50 ug via INTRAVENOUS

## 2018-02-22 MED ORDER — HEPARIN SODIUM (PORCINE) 1000 UNIT/ML IJ SOLN
INTRAMUSCULAR | Status: DC | PRN
  Administered 2018-02-22: 9000 [IU] via INTRAVENOUS

## 2018-02-22 MED ORDER — 0.9 % SODIUM CHLORIDE (POUR BTL) OPTIME
TOPICAL | Status: DC | PRN
  Administered 2018-02-22: 1000 mL

## 2018-02-22 MED ORDER — PAPAVERINE HCL 30 MG/ML IJ SOLN
INTRAMUSCULAR | Status: AC
  Filled 2018-02-22: qty 2

## 2018-02-22 MED ORDER — HEPARIN SODIUM (PORCINE) 5000 UNIT/ML IJ SOLN
INTRAMUSCULAR | Status: DC | PRN
  Administered 2018-02-22: 09:00:00

## 2018-02-22 MED ORDER — FENTANYL CITRATE (PF) 100 MCG/2ML IJ SOLN
INTRAMUSCULAR | Status: AC
  Filled 2018-02-22: qty 2

## 2018-02-22 MED ORDER — THROMBIN 20000 UNITS EX SOLR
CUTANEOUS | Status: AC
  Filled 2018-02-22: qty 20000

## 2018-02-22 MED ORDER — LIDOCAINE HCL (PF) 1 % IJ SOLN
INTRAMUSCULAR | Status: AC
  Filled 2018-02-22: qty 30

## 2018-02-22 MED ORDER — HEPARIN SODIUM (PORCINE) 1000 UNIT/ML IJ SOLN
INTRAMUSCULAR | Status: AC
  Filled 2018-02-22: qty 2

## 2018-02-22 MED ORDER — VANCOMYCIN HCL IN DEXTROSE 1-5 GM/200ML-% IV SOLN
INTRAVENOUS | Status: AC
Start: 2018-02-22 — End: 2018-02-22
  Filled 2018-02-22: qty 200

## 2018-02-22 MED ORDER — LIDOCAINE-EPINEPHRINE (PF) 1 %-1:200000 IJ SOLN
INTRAMUSCULAR | Status: AC
  Filled 2018-02-22: qty 30

## 2018-02-22 MED ORDER — SODIUM CHLORIDE 0.9 % IV SOLN
INTRAVENOUS | Status: DC | PRN
  Administered 2018-02-22: 1000 mg via INTRAVENOUS

## 2018-02-22 SURGICAL SUPPLY — 40 items
ARMBAND PINK RESTRICT EXTREMIT (MISCELLANEOUS) ×6 IMPLANT
CANISTER SUCT 3000ML PPV (MISCELLANEOUS) ×3 IMPLANT
CANNULA VESSEL 3MM 2 BLNT TIP (CANNULA) ×3 IMPLANT
CLIP VESOCCLUDE MED 6/CT (CLIP) ×3 IMPLANT
CLIP VESOCCLUDE SM WIDE 6/CT (CLIP) ×6 IMPLANT
COVER PROBE W GEL 5X96 (DRAPES) IMPLANT
DECANTER SPIKE VIAL GLASS SM (MISCELLANEOUS) ×3 IMPLANT
DERMABOND ADVANCED (GAUZE/BANDAGES/DRESSINGS) ×2
DERMABOND ADVANCED .7 DNX12 (GAUZE/BANDAGES/DRESSINGS) ×1 IMPLANT
ELECT REM PT RETURN 9FT ADLT (ELECTROSURGICAL) ×3
ELECTRODE REM PT RTRN 9FT ADLT (ELECTROSURGICAL) ×1 IMPLANT
GLOVE BIO SURGEON STRL SZ 6.5 (GLOVE) ×4 IMPLANT
GLOVE BIO SURGEON STRL SZ7.5 (GLOVE) ×3 IMPLANT
GLOVE BIO SURGEONS STRL SZ 6.5 (GLOVE) ×2
GLOVE BIOGEL PI IND STRL 6.5 (GLOVE) ×2 IMPLANT
GLOVE BIOGEL PI IND STRL 7.0 (GLOVE) ×2 IMPLANT
GLOVE BIOGEL PI IND STRL 7.5 (GLOVE) ×1 IMPLANT
GLOVE BIOGEL PI IND STRL 8 (GLOVE) ×1 IMPLANT
GLOVE BIOGEL PI INDICATOR 6.5 (GLOVE) ×4
GLOVE BIOGEL PI INDICATOR 7.0 (GLOVE) ×4
GLOVE BIOGEL PI INDICATOR 7.5 (GLOVE) ×2
GLOVE BIOGEL PI INDICATOR 8 (GLOVE) ×2
GLOVE ECLIPSE 7.0 STRL STRAW (GLOVE) ×3 IMPLANT
GOWN STRL REUS W/ TWL LRG LVL3 (GOWN DISPOSABLE) ×4 IMPLANT
GOWN STRL REUS W/TWL LRG LVL3 (GOWN DISPOSABLE) ×8
HOLDER TRACH TUBE VELCRO 19.5 (MISCELLANEOUS) ×3 IMPLANT
KIT BASIN OR (CUSTOM PROCEDURE TRAY) ×3 IMPLANT
KIT TURNOVER KIT B (KITS) ×3 IMPLANT
NS IRRIG 1000ML POUR BTL (IV SOLUTION) ×3 IMPLANT
PACK CV ACCESS (CUSTOM PROCEDURE TRAY) ×3 IMPLANT
PAD ARMBOARD 7.5X6 YLW CONV (MISCELLANEOUS) ×6 IMPLANT
SPONGE SURGIFOAM ABS GEL 100 (HEMOSTASIS) IMPLANT
SUT PROLENE 6 0 BV (SUTURE) ×6 IMPLANT
SUT VIC AB 3-0 SH 27 (SUTURE) ×4
SUT VIC AB 3-0 SH 27X BRD (SUTURE) ×2 IMPLANT
SUT VICRYL 4-0 PS2 18IN ABS (SUTURE) ×3 IMPLANT
SYR 50ML SLIP (SYRINGE) ×3 IMPLANT
TOWEL GREEN STERILE (TOWEL DISPOSABLE) ×3 IMPLANT
UNDERPAD 30X30 (UNDERPADS AND DIAPERS) ×3 IMPLANT
WATER STERILE IRR 1000ML POUR (IV SOLUTION) ×3 IMPLANT

## 2018-02-22 NOTE — Progress Notes (Signed)
Pulmonary Critical Care Medicine [facility] [place]   PULMONARY SERVICE  PROGRESS NOTE  Date of Service: 02/22/2018  Terri Ellis  ZOX:[id number]  DOB: 1955/12/20   DOA: 01/24/2018  Referring Physician: Carron Curie, MD  HPI: Terri Ellis is a 62 y.o. female seen for follow up of Acute on Chronic Respiratory Failure.  Comfortable without distress patient has been doing capping trials as an on 6 L right now  Medications: Reviewed on Rounds  Physical Exam:  Vitals: Temperature 97.6 pulse 89 respiratory rate 22 blood pressure 125/45 saturations 96%  Ventilator Settings off the ventilator  . General: Comfortable at this time . Eyes: Grossly normal lids, irises & conjunctiva . ENT: grossly tongue is normal . Neck: no obvious mass . Cardiovascular: S1 S2 normal no gallop . Respiratory: No rhonchi rales expansion is equal . Abdomen: soft . Skin: no rash seen on limited exam . Musculoskeletal: not rigid . Psychiatric:unable to assess . Neurologic: no seizure no involuntary movements         Labs on Admission:  Basic Metabolic Panel: Recent Labs  Lab 02/16/18 0540 02/17/18 1108 02/18/18 0643 02/21/18 0421 02/22/18 0839  NA 135 135 134* 133* 137  K 4.2 3.9 4.3 4.3 4.1  CL 95* 99* 96* 96*  --   CO2 24 23 21* 22  --   GLUCOSE 94 165* 122* 105* 92  BUN 92* 59* 92* 120*  --   CREATININE 3.20* 2.55* 3.11* 3.33*  --   CALCIUM 9.2 8.9 9.0 9.1  --   PHOS 4.5  --  5.5* 6.2*  --     Liver Function Tests: Recent Labs  Lab 02/16/18 0540 02/17/18 1108 02/18/18 0643 02/21/18 0421  AST  --  193*  --   --   ALT  --  265*  --   --   ALKPHOS  --  735*  --   --   BILITOT  --  7.3*  --   --   PROT  --  6.0*  --   --   ALBUMIN 2.7* 2.6* 2.6* 2.5*   No results for input(s): LIPASE, AMYLASE in the last 168 hours. No results for input(s): AMMONIA in the last 168 hours.  CBC: Recent Labs  Lab 02/16/18 0540 02/17/18 1108 02/18/18 0824  02/19/18 0227 02/20/18 0527 02/21/18 0421 02/22/18 0839  WBC 12.1* 10.6* 12.4*  --  11.0* 12.6*  --   HGB 5.4* 7.1* 6.7* 7.3* 6.8* 7.1* 6.8*  HCT 17.4* 22.6* 20.4* 22.4* 21.1* 21.8* 20.0*  MCV 98.9 94.6 93.6  --  95.0 93.6  --   PLT 187 205 191  --  185 195  --     Cardiac Enzymes: No results for input(s): CKTOTAL, CKMB, CKMBINDEX, TROPONINI in the last 168 hours.  BNP (last 3 results) No results for input(s): BNP in the last 8760 hours.  ProBNP (last 3 results) No results for input(s): PROBNP in the last 8760 hours.  Radiological Exams on Admission: No results found.  Assessment/Plan Principal Problem:   Acute on chronic respiratory failure with hypoxia (HCC) Active Problems:   Acute tubular necrosis (HCC)   Lobar pneumonia (HCC)   Severe sepsis with septic shock (HCC)   ARDS (adult respiratory distress syndrome) (HCC)   Acute systolic CHF (congestive heart failure) (HCC)   1. Acute on chronic respiratory failure with hypoxia we should be able to hopefully decannulate in the next day or 2 continue with pulmonary toilet secretion management. 2.  Acute tubular necrosis patient is at baseline for dialysis 3. Lobar pneumonia treated 4. ARDS clinically resolved 5. Severe sepsis hemodynamically stable 6. Congestive heart failure systolic stable   I have personally seen and evaluated the patient, evaluated laboratory and imaging results, formulated the assessment and plan and placed orders. The Patient requires high complexity decision making for assessment and support.  Case was discussed on Rounds with the Respiratory Therapy Staff  Yevonne Pax, MD [facility] Pulmonary Critical Care Medicine Sleep Medicine

## 2018-02-22 NOTE — Progress Notes (Signed)
CRITICAL VALUE ALERT  Critical Value:  Hemoglobin 6.8   Date & Time Notied:  02/22/2018 5784  Provider Notified: Kipp Brood, MD; in addition notified nurse in [facility]  Orders Received/Actions taken: Will come to see patient.

## 2018-02-22 NOTE — Op Note (Signed)
    NAME: Terri Ellis    MRN: [id number] DOB: 1956-07-16    DATE OF OPERATION: 02/22/2018  PREOP DIAGNOSIS:    End-stage renal disease  POSTOP DIAGNOSIS:    Same  PROCEDURE:    Left brachiocephalic AV fistula  SURGEON: Di Kindle. Edilia Bo, MD, FACS  ASSIST: Clinton Gallant, PA  ANESTHESIA: General  EBL: Minimal  INDICATIONS:    Joesphine Schemm is a 62 y.o. female who has a functioning right IJ tunneled dialysis catheter.  I was asked to place a fistula.  FINDINGS:   4 mm upper arm cephalic vein.  The vein was fairly deep so I am mobilized the vein to the mid upper arm to help to superficialize the vein.  TECHNIQUE:   The patient was taken to the operating room and received a general anesthetic.  The left arm was prepped and draped in usual sterile fashion.  A transverse incision was made just above the antecubital level.  The brachial artery was dissected free beneath the fascia.  It was quite deep.  The cephalic vein was dissected free and mobilized quite far distally to allow it to reach the artery.  The patient was heparinized.  The brachial artery was clamped proximally and distally and a longitudinal arteriotomy was made.  The vein was sewn into side to the artery using continuous 6-0 Prolene suture.  There was a good thrill in the fistula.  Given that the vein was quite deep I divided the fascia overlying the vein and mobilized it.  I made a separate incision in the mid upper arm to continue this dissection.  Hemostasis was obtained in the wounds.  Each of the wounds was closed with a deep layer of 3-0 Vicryl the skin closed with 4-0 Vicryl.  Dermabond was applied.  The patient tolerated the procedure well was transferred to the recovery room in stable condition.  All needle sponge counts were correct.  Waverly Ferrari, MD, FACS [facility]  DATE OF DICTATION:   02/22/2018

## 2018-02-22 NOTE — Anesthesia Procedure Notes (Signed)
Date/Time: 02/22/2018 9:26 AM Performed by: Marena Chancy, CRNA Pre-anesthesia Checklist: Patient identified, Emergency Drugs available, Suction available, Patient being monitored and Timeout performed Patient Re-evaluated:Patient Re-evaluated prior to induction Oxygen Delivery Method: Circle system utilized Preoxygenation: Pre-oxygenation with 100% oxygen Induction Type: Tracheostomy, Inhalational induction and IV induction

## 2018-02-22 NOTE — Interval H&P Note (Signed)
History and Physical Interval Note:  02/22/2018 8:56 AM  [name]  has presented today for surgery, with the diagnosis of END STAGE RENAL DISEASE FOR HEMODIALYSIS ACCESS  The various methods of treatment have been discussed with the patient and family. After consideration of risks, benefits and other options for treatment, the patient has consented to  Procedure(s): ARTERIOVENOUS (AV) FISTULA CREATION VERSUS GRAFT LEFT ARM (Left) as a surgical intervention .  The patient's history has been reviewed, patient examined, no change in status, stable for surgery.  I have reviewed the patient's chart and labs.  Questions were answered to the patient's satisfaction.     Terri Ellis

## 2018-02-22 NOTE — Transfer of Care (Signed)
Immediate Anesthesia Transfer of Care Note  Patient: Terri Ellis  Procedure(s) Performed: ARTERIOVENOUS (AV) FISTULA CREATION VERSUS GRAFT LEFT ARM (Left Arm Upper)  Patient Location: PACU  Anesthesia Type:General  Level of Consciousness: awake, alert  and oriented  Airway & Oxygen Therapy: Patient Spontanous Breathing and Patient connected to tracheostomy mask oxygen  Post-op Assessment: Report given to RN, Post -op Vital signs reviewed and stable and Patient moving all extremities X 4  Post vital signs: Reviewed and stable  Last Vitals:  Vitals Value Taken Time  BP 107/95 02/22/2018 11:15 AM  Temp    Pulse 107 02/22/2018 11:22 AM  Resp 24 02/22/2018 11:22 AM  SpO2 97 % 02/22/2018 11:22 AM  Vitals shown include unvalidated device data.  Last Pain: There were no vitals filed for this visit.       Complications: No apparent anesthesia complications

## 2018-02-22 NOTE — Anesthesia Preprocedure Evaluation (Signed)
Anesthesia Evaluation  Patient identified by MRN, date of birth, ID band Patient awake    Reviewed: Allergy & Precautions, NPO status , Patient's Chart, lab work & pertinent test results  Airway Mallampati: Trach       Dental   Pulmonary     + decreased breath sounds      Cardiovascular  Rhythm:Regular Rate:Normal     Neuro/Psych    GI/Hepatic   Endo/Other    Renal/GU      Musculoskeletal   Abdominal (+) + obese,   Peds  Hematology   Anesthesia Other Findings   Reproductive/Obstetrics                             Anesthesia Physical Anesthesia Plan  ASA: III  Anesthesia Plan: General   Post-op Pain Management:    Induction:   PONV Risk Score and Plan:   Airway Management Planned: Tracheostomy  Additional Equipment:   Intra-op Plan:   Post-operative Plan: Possible Post-op intubation/ventilation  Informed Consent: I have reviewed the patients History and Physical, chart, labs and discussed the procedure including the risks, benefits and alternatives for the proposed anesthesia with the patient or authorized representative who has indicated his/her understanding and acceptance.     Plan Discussed with: CRNA and Anesthesiologist  Anesthesia Plan Comments:         Anesthesia Quick Evaluation

## 2018-02-22 NOTE — Telephone Encounter (Signed)
-----   Message from Sharee Pimple, RN sent at 02/22/2018 10:59 AM EDT ----- Regarding: 6 weeks postop AVF with duplex   ----- Message ----- From: Chuck Hint, MD Sent: 02/22/2018  10:57 AM To: Vvs Charge Pool Subject: charge and f/u                                  PROCEDURE:   Left brachiocephalic AV fistula  SURGEON: Di Kindle. Edilia Bo, MD, FACS  ASSIST: Clinton Gallant, PA  She will need a follow-up visit in 6 weeks with a duplex at that time to check on the maturation of the fistula.  Thank you. CD

## 2018-02-22 NOTE — Telephone Encounter (Signed)
sch appt 04/06/18 1pm Dialysis duplex 2pm p/o/ PA s/p L BC AVF per stf msg

## 2018-02-22 NOTE — Anesthesia Postprocedure Evaluation (Signed)
Anesthesia Post Note  Patient: Terri Ellis  Procedure(s) Performed: ARTERIOVENOUS (AV) FISTULA CREATION VERSUS GRAFT LEFT ARM (Left Arm Upper)     Patient location during evaluation: PACU Anesthesia Type: General Level of consciousness: awake and alert Pain management: pain level controlled Vital Signs Assessment: post-procedure vital signs reviewed and stable Respiratory status: spontaneous breathing, nonlabored ventilation, respiratory function stable and patient connected to tracheostomy mask oxygen Cardiovascular status: blood pressure returned to baseline and stable Postop Assessment: no apparent nausea or vomiting Anesthetic complications: no    Last Vitals:  Vitals:   02/22/18 1215 02/22/18 1220  BP:    Pulse: 92 93  Resp: 15   Temp:  (!) 36.3 C  SpO2: 100% 100%    Last Pain:  Vitals:   02/22/18 1220  PainSc: Asleep                 [name]

## 2018-02-23 ENCOUNTER — Encounter (HOSPITAL_COMMUNITY): Payer: Self-pay | Admitting: Vascular Surgery

## 2018-02-23 DIAGNOSIS — I5021 Acute systolic (congestive) heart failure: Secondary | ICD-10-CM | POA: Diagnosis not present

## 2018-02-23 DIAGNOSIS — J9621 Acute and chronic respiratory failure with hypoxia: Secondary | ICD-10-CM | POA: Diagnosis not present

## 2018-02-23 DIAGNOSIS — J80 Acute respiratory distress syndrome: Secondary | ICD-10-CM | POA: Diagnosis not present

## 2018-02-23 DIAGNOSIS — N170 Acute kidney failure with tubular necrosis: Secondary | ICD-10-CM | POA: Diagnosis not present

## 2018-02-23 LAB — BPAM RBC
Blood Product Expiration Date: 201906172359
Blood Product Expiration Date: 201906212359
Blood Product Expiration Date: 201906212359
ISSUE DATE / TIME: 201905261246
ISSUE DATE / TIME: 201905281458
ISSUE DATE / TIME: 201905281842
UNIT TYPE AND RH: 5100
UNIT TYPE AND RH: 5100
UNIT TYPE AND RH: 5100

## 2018-02-23 LAB — CBC
HEMATOCRIT: 23.5 % — AB (ref 36.0–46.0)
HEMOGLOBIN: 7.8 g/dL — AB (ref 12.0–15.0)
MCH: 30.5 pg (ref 26.0–34.0)
MCHC: 33.2 g/dL (ref 30.0–36.0)
MCV: 91.8 fL (ref 78.0–100.0)
Platelets: 177 10*3/uL (ref 150–400)
RBC: 2.56 MIL/uL — AB (ref 3.87–5.11)
RDW: 21 % — ABNORMAL HIGH (ref 11.5–15.5)
WBC: 13 10*3/uL — ABNORMAL HIGH (ref 4.0–10.5)

## 2018-02-23 LAB — TYPE AND SCREEN
ABO/RH(D): O POS
Antibody Screen: NEGATIVE
Unit division: 0
Unit division: 0
Unit division: 0

## 2018-02-23 NOTE — Progress Notes (Signed)
[facility]  ROUNDING NOTE   Subjective:  Patient continues on dialysis on Monday, Wednesday, Friday. She is currently awake and alert and conversant. Appears a bit tired at the moment.   Objective:  Vital signs in last 24 hours:  Temperature 97.6 pulse 91 respirations 20 blood pressure 98/69  Physical Exam: General: Chronically ill appearing  Head: Normocephalic, atraumatic. Moist oral mucosal membranes  Eyes: Anicteric  Neck: Tracheostomy in place, capped  Lungs:  Bilateral rhonchi, normal effort  Heart: S1S2 no rubs  Abdomen:  Soft, nontender, PEG  Extremities: 1+ peripheral edema.  Neurologic: Awake, alert, follows simple commands  Skin: No lesions  Access: Right internal jugular PermCath    Basic Metabolic Panel: Recent Labs  Lab 02/17/18 1108 02/18/18 0643 02/21/18 0421 02/22/18 0839  NA 135 134* 133* 137  K 3.9 4.3 4.3 4.1  CL 99* 96* 96*  --   CO2 23 21* 22  --   GLUCOSE 165* 122* 105* 92  BUN 59* 92* 120*  --   CREATININE 2.55* 3.11* 3.33*  --   CALCIUM 8.9 9.0 9.1  --   PHOS  --  5.5* 6.2*  --     Liver Function Tests: Recent Labs  Lab 02/17/18 1108 02/18/18 0643 02/21/18 0421  AST 193*  --   --   ALT 265*  --   --   ALKPHOS 735*  --   --   BILITOT 7.3*  --   --   PROT 6.0*  --   --   ALBUMIN 2.6* 2.6* 2.5*   No results for input(s): LIPASE, AMYLASE in the last 168 hours. No results for input(s): AMMONIA in the last 168 hours.  CBC: Recent Labs  Lab 02/17/18 1108 02/18/18 0824 02/19/18 0227 02/20/18 0527 02/21/18 0421 02/22/18 0839 02/23/18 0807  WBC 10.6* 12.4*  --  11.0* 12.6*  --  13.0*  HGB 7.1* 6.7* 7.3* 6.8* 7.1* 6.8* 7.8*  HCT 22.6* 20.4* 22.4* 21.1* 21.8* 20.0* 23.5*  MCV 94.6 93.6  --  95.0 93.6  --  91.8  PLT 205 191  --  185 195  --  177    Cardiac Enzymes: No results for input(s): CKTOTAL, CKMB, CKMBINDEX, TROPONINI in the last 168 hours.  BNP: Invalid input(s): POCBNP  CBG: No results for  input(s): GLUCAP in the last 168 hours.  Microbiology: Results for orders placed or performed during the hospital encounter of 01/24/18  Culture, Urine     Status: None   Collection Time: 01/28/18  3:00 AM  Result Value Ref Range Status   Specimen Description URINE, RANDOM  Final   Special Requests NONE  Final   Culture   Final    NO GROWTH Performed at [facility], [address], [place], [place] [address]    Report Status 01/29/2018 FINAL  Final    Coagulation Studies: No results for input(s): LABPROT, INR in the last 72 hours.  Urinalysis: No results for input(s): COLORURINE, LABSPEC, PHURINE, GLUCOSEU, HGBUR, BILIRUBINUR, KETONESUR, PROTEINUR, UROBILINOGEN, NITRITE, LEUKOCYTESUR in the last 72 hours.  Invalid input(s): APPERANCEUR    Imaging: No results found.   Medications:       Assessment/ Plan:  62 y.o. female with a PMHx of congestive heart failure, chronic kidney disease stage III, acute respiratory failure status post tracheostomy placement, hypertension, iron deficiency anemia, diabetes mellitus type 2, hyperlipidemia, obesity, who was admitted to [facility] on 01/24/2018 for ongoing treatment of pneumonia, acute respiratory failure, acute  renal failure requiring hemodialysis.  1.  Acute renal failure/chronic kidney disease stage III baseline creatinine 1.8.  Patient was seen by nephrology at the outside hospital.  She was initially started on CRRT and subsequently transitioned to intermittent hemodialysis. -We will maintain the patient on Monday, Wednesday, Friday dialysis schedule for now.  Next dialysis treatment on Friday.  2.  Acute respiratory failure.  Patient is done well from a respiratory perspective.  Her tracheostomy remains capped at this time.  3.  Anemia of chronic kidney disease.  Hemoglobin was as low as 6.8 recently but now up to 7.8 today.  4.  Secondary hyperparathyroidism.  We will continue to monitor serum phosphorus  trend.  5.  Hyperkalemia.  Potassium down to 4.1 at last check.  Continue to periodically monitor.     LOS: 0 Teniqua Marron 5/29/20195:14 PM

## 2018-02-23 NOTE — Progress Notes (Signed)
   VASCULAR SURGERY ASSESSMENT & PLAN:   1 Day Post-Op s/p: Left brachial-cephalic AVF  Good thrill in fistula.  Palpable left radial pulse.  I have arranged follow-up in 6 weeks to check on the maturation of her fistula  Vascular surgery will be available as needed.  SUBJECTIVE:   No complaints.  PHYSICAL EXAM:   Vitals:   02/22/18 1200 02/22/18 1210 02/22/18 1215 02/22/18 1220  BP:  91/60    Pulse: (!) 101 93 92 93  Resp: Temp:    (!) 97.3 F (36.3 C)  SpO2: 95% 93% 100% 100%   Good thrill in left upper arm fistula. Palpable left radial pulse. Her 2 incisions look fine.  LABS:   Lab Results  Component Value Date   WBC 12.6 (H) 02/21/2018   HGB 6.8 (LL) 02/22/2018   HCT 20.0 (L) 02/22/2018   MCV 93.6 02/21/2018   PLT 195 02/21/2018   PROBLEM LIST:    Principal Problem:   Acute on chronic respiratory failure with hypoxia (HCC) Active Problems:   Acute tubular necrosis (HCC)   Lobar pneumonia (HCC)   Severe sepsis with septic shock (HCC)   ARDS (adult respiratory distress syndrome) (HCC)   Acute systolic CHF (congestive heart failure) (HCC)  CURRENT MEDS:    Waverly Ferrari Beeper: [phone] Office: [phone] 02/23/2018

## 2018-02-23 NOTE — Progress Notes (Signed)
Pulmonary Critical Care Medicine [facility] [place]   PULMONARY SERVICE  PROGRESS NOTE  Date of Service: 02/23/2018  Terri Ellis  WUJ:[id number]  DOB: 03-01-1956   DOA: 01/24/2018  Referring Physician: Carron Curie, MD  HPI: Terri Ellis is a 62 y.o. female seen for follow up of Acute on Chronic Respiratory Failure.  Patient is capping doing very well.  Has been with a #4 trach no distress is noted.  Medications: Reviewed on Rounds  Physical Exam:  Vitals: Temperature 98.0 pulse 101 respiratory rate 30 blood pressure 114/76 saturations 95%  Ventilator Settings capping no distress  . General: Comfortable at this time . Eyes: Grossly normal lids, irises & conjunctiva . ENT: grossly tongue is normal . Neck: no obvious mass . Cardiovascular: S1 S2 normal no gallop . Respiratory: No rhonchi or rales . Abdomen: soft . Skin: no rash seen on limited exam . Musculoskeletal: not rigid . Psychiatric:unable to assess . Neurologic: no seizure no involuntary movements         Labs on Admission:  Basic Metabolic Panel: Recent Labs  Lab 02/17/18 1108 02/18/18 0643 02/21/18 0421 02/22/18 0839  NA 135 134* 133* 137  K 3.9 4.3 4.3 4.1  CL 99* 96* 96*  --   CO2 23 21* 22  --   GLUCOSE 165* 122* 105* 92  BUN 59* 92* 120*  --   CREATININE 2.55* 3.11* 3.33*  --   CALCIUM 8.9 9.0 9.1  --   PHOS  --  5.5* 6.2*  --     Liver Function Tests: Recent Labs  Lab 02/17/18 1108 02/18/18 0643 02/21/18 0421  AST 193*  --   --   ALT 265*  --   --   ALKPHOS 735*  --   --   BILITOT 7.3*  --   --   PROT 6.0*  --   --   ALBUMIN 2.6* 2.6* 2.5*   No results for input(s): LIPASE, AMYLASE in the last 168 hours. No results for input(s): AMMONIA in the last 168 hours.  CBC: Recent Labs  Lab 02/17/18 1108 02/18/18 0824 02/19/18 0227 02/20/18 0527 02/21/18 0421 02/22/18 0839 02/23/18 0807  WBC 10.6* 12.4*  --  11.0* 12.6*  --  13.0*  HGB 7.1* 6.7*  7.3* 6.8* 7.1* 6.8* 7.8*  HCT 22.6* 20.4* 22.4* 21.1* 21.8* 20.0* 23.5*  MCV 94.6 93.6  --  95.0 93.6  --  91.8  PLT 205 191  --  185 195  --  177    Cardiac Enzymes: No results for input(s): CKTOTAL, CKMB, CKMBINDEX, TROPONINI in the last 168 hours.  BNP (last 3 results) No results for input(s): BNP in the last 8760 hours.  ProBNP (last 3 results) No results for input(s): PROBNP in the last 8760 hours.  Radiological Exams on Admission: No results found.  Assessment/Plan Principal Problem:   Acute on chronic respiratory failure with hypoxia (HCC) Active Problems:   Acute tubular necrosis (HCC)   Lobar pneumonia (HCC)   Severe sepsis with septic shock (HCC)   ARDS (adult respiratory distress syndrome) (HCC)   Acute systolic CHF (congestive heart failure) (HCC)   1. Acute on chronic respiratory failure with hypoxia we will continue with capping trials on 20 9D cannulated her 5 at least Monday.  The patient has tolerated it very well. 2. Acute renal failure followed by nephrology will continue with supportive care for dialysis 3. Lobar pneumonia treated with antibiotics 4. ARDS clinically resolved 5. Acute systolic  heart failure right now is compensated 6. Severe sepsis with shock stable hemodynamically   I have personally seen and evaluated the patient, evaluated laboratory and imaging results, formulated the assessment and plan and placed orders. The Patient requires high complexity decision making for assessment and support.  Case was discussed on Rounds with the Respiratory Therapy Staff  Yevonne Pax, MD [facility] Pulmonary Critical Care Medicine Sleep Medicine

## 2018-02-24 ENCOUNTER — Other Ambulatory Visit: Payer: Self-pay

## 2018-02-24 DIAGNOSIS — I5021 Acute systolic (congestive) heart failure: Secondary | ICD-10-CM | POA: Diagnosis not present

## 2018-02-24 DIAGNOSIS — Z992 Dependence on renal dialysis: Secondary | ICD-10-CM

## 2018-02-24 DIAGNOSIS — Z48812 Encounter for surgical aftercare following surgery on the circulatory system: Secondary | ICD-10-CM

## 2018-02-24 DIAGNOSIS — J80 Acute respiratory distress syndrome: Secondary | ICD-10-CM | POA: Diagnosis not present

## 2018-02-24 DIAGNOSIS — R6521 Severe sepsis with septic shock: Secondary | ICD-10-CM | POA: Diagnosis not present

## 2018-02-24 DIAGNOSIS — N186 End stage renal disease: Secondary | ICD-10-CM

## 2018-02-24 DIAGNOSIS — J9621 Acute and chronic respiratory failure with hypoxia: Secondary | ICD-10-CM | POA: Diagnosis not present

## 2018-02-24 DIAGNOSIS — N170 Acute kidney failure with tubular necrosis: Secondary | ICD-10-CM | POA: Diagnosis not present

## 2018-02-24 DIAGNOSIS — A419 Sepsis, unspecified organism: Secondary | ICD-10-CM | POA: Diagnosis not present

## 2018-02-24 LAB — BLOOD GAS, ARTERIAL
Acid-base deficit: 0.5 mmol/L (ref 0.0–2.0)
BICARBONATE: 23.7 mmol/L (ref 20.0–28.0)
O2 Content: 8 L/min
O2 Saturation: 92 %
PH ART: 7.41 (ref 7.350–7.450)
PO2 ART: 58.6 mmHg — AB (ref 83.0–108.0)
Patient temperature: 97.3
pCO2 arterial: 37.8 mmHg (ref 32.0–48.0)

## 2018-02-24 LAB — PTH, INTACT AND CALCIUM
CALCIUM TOTAL (PTH): 9.1 mg/dL (ref 8.7–10.3)
PTH: 146 pg/mL — AB (ref 15–65)

## 2018-02-24 NOTE — Progress Notes (Signed)
Pulmonary Critical Care Medicine [facility] [place]   PULMONARY SERVICE  PROGRESS NOTE  Date of Service: 02/24/2018  Terri Ellis  ZOX:[id number]  DOB: [birth year]   DOA: 01/24/2018  Referring Physician: Carron Curie, MD  HPI: Terri Ellis is a 62 y.o. female seen for follow up of Acute on Chronic Respiratory Failure.  Patient right now is afebrile without distress has been doing capping her saturations have been dropping periodically.  Data ABG PO2 was 58 so therefore was placed back on T collar  Medications: Reviewed on Rounds  Physical Exam:  Vitals: Temperature 98.2 pulse 96 respiratory rate 18 blood pressure 102/43 saturations 95%  Ventilator Settings patient was doing capping however had to be placed back on the T collar.  We will place her on 35% FiO2 and see how she tolerates that.  We will continue with secretion management pulmonary toilet also  . General: Comfortable at this time . Eyes: Grossly normal lids, irises & conjunctiva . ENT: grossly tongue is normal . Neck: no obvious mass . Cardiovascular: S1 S2 normal no gallop . Respiratory: No rhonchi noted coarse breath sounds bilaterally . Abdomen: soft . Skin: no rash seen on limited exam . Musculoskeletal: not rigid . Psychiatric:unable to assess . Neurologic: no seizure no involuntary movements         Labs on Admission:  Basic Metabolic Panel: Recent Labs  Lab 02/18/18 0643 02/21/18 0421 02/22/18 0839 02/23/18 0807  NA 134* 133* 137  --   K 4.3 4.3 4.1  --   CL 96* 96*  --   --   CO2 21* 22  --   --   GLUCOSE 122* 105* 92  --   BUN 92* 120*  --   --   CREATININE 3.11* 3.33*  --   --   CALCIUM 9.0 9.1  --  9.1  PHOS 5.5* 6.2*  --   --     Liver Function Tests: Recent Labs  Lab 02/18/18 0643 02/21/18 0421  ALBUMIN 2.6* 2.5*   No results for input(s): LIPASE, AMYLASE in the last 168 hours. No results for input(s): AMMONIA in the last 168 hours.  CBC: Recent  Labs  Lab 02/18/18 0824 02/19/18 0227 02/20/18 0527 02/21/18 0421 02/22/18 0839 02/23/18 0807  WBC 12.4*  --  11.0* 12.6*  --  13.0*  HGB 6.7* 7.3* 6.8* 7.1* 6.8* 7.8*  HCT 20.4* 22.4* 21.1* 21.8* 20.0* 23.5*  MCV 93.6  --  95.0 93.6  --  91.8  PLT 191  --  185 195  --  177    Cardiac Enzymes: No results for input(s): CKTOTAL, CKMB, CKMBINDEX, TROPONINI in the last 168 hours.  BNP (last 3 results) No results for input(s): BNP in the last 8760 hours.  ProBNP (last 3 results) No results for input(s): PROBNP in the last 8760 hours.  Radiological Exams on Admission: No results found.  Assessment/Plan Principal Problem:   Acute on chronic respiratory failure with hypoxia (HCC) Active Problems:   Acute tubular necrosis (HCC)   Lobar pneumonia (HCC)   Severe sepsis with septic shock (HCC)   ARDS (adult respiratory distress syndrome) (HCC)   Acute systolic CHF (congestive heart failure) (HCC)   1. Acute on chronic respiratory failure with hypoxia we will place on T collar as already discussed.  Will continue secretion management pulmonary toilet.  Overall prognosis remains guarded. 2. Lobar pneumonia has been treated would consider follow-up chest x-rays in setting of the acute hypoxia. 3.  Severe sepsis hemodynamically stable 4. ARDS have been clinically resolved continue to monitor 5. Acute systolic heart failure we will do follow-up chest x-ray since she is having with hypoxia again   I have personally seen and evaluated the patient, evaluated laboratory and imaging results, formulated the assessment and plan and placed orders. The Patient requires high complexity decision making for assessment and support.  Case was discussed on Rounds with the Respiratory Therapy Staff  Yevonne Pax, MD [facility] Pulmonary Critical Care Medicine Sleep Medicine

## 2018-02-25 ENCOUNTER — Other Ambulatory Visit (HOSPITAL_COMMUNITY): Payer: Self-pay

## 2018-02-25 DIAGNOSIS — N170 Acute kidney failure with tubular necrosis: Secondary | ICD-10-CM | POA: Diagnosis not present

## 2018-02-25 DIAGNOSIS — I5021 Acute systolic (congestive) heart failure: Secondary | ICD-10-CM | POA: Diagnosis not present

## 2018-02-25 DIAGNOSIS — J80 Acute respiratory distress syndrome: Secondary | ICD-10-CM | POA: Diagnosis not present

## 2018-02-25 DIAGNOSIS — J9621 Acute and chronic respiratory failure with hypoxia: Secondary | ICD-10-CM | POA: Diagnosis not present

## 2018-02-25 LAB — TYPE AND SCREEN
ABO/RH(D): O POS
Antibody Screen: NEGATIVE
UNIT DIVISION: 0
UNIT DIVISION: 0

## 2018-02-25 LAB — BPAM RBC
BLOOD PRODUCT EXPIRATION DATE: 201906262359
Blood Product Expiration Date: 201906262359
ISSUE DATE / TIME: 201905310233
ISSUE DATE / TIME: 201905310233
UNIT TYPE AND RH: 5100
Unit Type and Rh: 5100

## 2018-02-25 LAB — RENAL FUNCTION PANEL
ALBUMIN: 2.5 g/dL — AB (ref 3.5–5.0)
Anion gap: 13 (ref 5–15)
BUN: 83 mg/dL — ABNORMAL HIGH (ref 6–20)
CHLORIDE: 93 mmol/L — AB (ref 101–111)
CO2: 23 mmol/L (ref 22–32)
Calcium: 9.1 mg/dL (ref 8.9–10.3)
Creatinine, Ser: 2.86 mg/dL — ABNORMAL HIGH (ref 0.44–1.00)
GFR, EST AFRICAN AMERICAN: 19 mL/min — AB (ref >60)
GFR, EST NON AFRICAN AMERICAN: 17 mL/min — AB (ref >60)
Glucose, Bld: 100 mg/dL — ABNORMAL HIGH (ref 65–99)
PHOSPHORUS: 6.5 mg/dL — AB (ref 2.5–4.6)
POTASSIUM: 4.2 mmol/L (ref 3.5–5.1)
Sodium: 129 mmol/L — ABNORMAL LOW (ref 135–145)

## 2018-02-25 LAB — CBC
HEMATOCRIT: 20.3 % — AB (ref 36.0–46.0)
HEMOGLOBIN: 6.4 g/dL — AB (ref 12.0–15.0)
MCH: 30.5 pg (ref 26.0–34.0)
MCHC: 31.5 g/dL (ref 30.0–36.0)
MCV: 96.7 fL (ref 78.0–100.0)
Platelets: 214 10*3/uL (ref 150–400)
RBC: 2.1 MIL/uL — AB (ref 3.87–5.11)
RDW: 21.7 % — ABNORMAL HIGH (ref 11.5–15.5)
WBC: 16.1 10*3/uL — AB (ref 4.0–10.5)

## 2018-02-25 LAB — PREPARE RBC (CROSSMATCH)

## 2018-02-25 NOTE — Progress Notes (Signed)
[name]  ROUNDING NOTE   Subjective:  Patient seen at bedside. She just completed hemodialysis. No ultrafiltration performed. Hemoglobin currently down to 6.4.   Objective:  Vital signs in last 24 hours:  Pulse 105 respirations 16 blood pressure 97/65  Physical Exam: General: Chronically ill appearing  Head: Normocephalic, atraumatic. Moist oral mucosal membranes  Eyes: Anicteric  Neck: Tracheostomy in place, capped  Lungs:  Bilateral rhonchi, normal effort  Heart: S1S2 no rubs  Abdomen:  Soft, nontender, PEG  Extremities: 1+ peripheral edema.  Neurologic: Awake, alert, follows simple commands  Skin: No lesions  Access: Right internal jugular PermCath, LUE AVF    Basic Metabolic Panel: Recent Labs  Lab 02/21/18 0421 02/22/18 0839 02/23/18 0807 02/25/18 0900  NA 133* 137  --  129*  K 4.3 4.1  --  4.2  CL 96*  --   --  93*  CO2 22  --   --  23  GLUCOSE 105* 92  --  100*  BUN 120*  --   --  83*  CREATININE 3.33*  --   --  2.86*  CALCIUM 9.1  --  9.1 9.1  PHOS 6.2*  --   --  6.5*    Liver Function Tests: Recent Labs  Lab 02/21/18 0421 02/25/18 0900  ALBUMIN 2.5* 2.5*   No results for input(s): LIPASE, AMYLASE in the last 168 hours. No results for input(s): AMMONIA in the last 168 hours.  CBC: Recent Labs  Lab 02/20/18 0527 02/21/18 0421 02/22/18 0839 02/23/18 0807 02/25/18 0900  WBC 11.0* 12.6*  --  13.0* 16.1*  HGB 6.8* 7.1* 6.8* 7.8* 6.4*  HCT 21.1* 21.8* 20.0* 23.5* 20.3*  MCV 95.0 93.6  --  91.8 96.7  PLT 185 195  --  177 214    Cardiac Enzymes: No results for input(s): CKTOTAL, CKMB, CKMBINDEX, TROPONINI in the last 168 hours.  BNP: Invalid input(s): POCBNP  CBG: No results for input(s): GLUCAP in the last 168 hours.  Microbiology: Results for orders placed or performed during the hospital encounter of 01/24/18  Culture, Urine     Status: None   Collection Time: 01/28/18  3:00 AM  Result Value Ref Range Status   Specimen Description URINE, RANDOM  Final   Special Requests NONE  Final   Culture   Final    NO GROWTH Performed at [facility], [address] [place], [place] [address]    Report Status 01/29/2018 FINAL  Final    Coagulation Studies: No results for input(s): LABPROT, INR in the last 72 hours.  Urinalysis: No results for input(s): COLORURINE, LABSPEC, PHURINE, GLUCOSEU, HGBUR, BILIRUBINUR, KETONESUR, PROTEINUR, UROBILINOGEN, NITRITE, LEUKOCYTESUR in the last 72 hours.  Invalid input(s): APPERANCEUR    Imaging: Dg Chest Port 1 View  Result Date: 02/25/2018 CLINICAL DATA:  62 year old female dialysis patient.  Hypoxia. EXAM: PORTABLE CHEST 1 VIEW COMPARISON:  02/08/2018 and earlier. FINDINGS: Portable AP semi upright view at 1204 hours. Stable tracheostomy. Stable right IJ approach dialysis catheter. Increased bilateral pulmonary interstitial opacity. Mild obscuration of the diaphragm. Stable cardiomegaly and mediastinal contours. No pneumothorax. No large pleural effusion or consolidation. Paucity of bowel gas in the upper abdomen. IMPRESSION: 1. Increased bilateral pulmonary interstitial opacity, favor acute interstitial edema. 2. Lung base hypo ventilation, favor atelectasis. Small pleural effusions possible. Electronically Signed   By: Odessa FlemingH  Hall M.D.   On: 02/25/2018 12:19     Medications:       Assessment/ Plan:  62 y.o. female with a PMHx of congestive heart failure, chronic kidney disease stage III, acute respiratory failure status post tracheostomy placement, hypertension, iron deficiency anemia, diabetes mellitus type 2, hyperlipidemia, obesity, who was admitted to [facility] on 01/24/2018 for ongoing treatment of pneumonia, acute respiratory failure, acute renal failure requiring hemodialysis.  1.  Acute renal failure/chronic kidney disease stage III baseline creatinine 1.8.  Patient was seen by nephrology at the outside hospital.  She was initially started on  CRRT and subsequently transitioned to intermittent hemodialysis. -Patient completed hemodialysis today without ultrafiltration secondary to hypotension.  We will plan for dialysis again on Monday.  2.  Acute respiratory failure.  Continues to do well with Tracheostomy.  3.  Anemia of chronic kidney disease.  Hemoglobin down to 6.4.  She will be receiving blood transfusion later today.  4.  Secondary hyperparathyroidism.  Phosphorus was a bit high today at 6.5.  If still high next week we will consider adding binders.  5.  Hyperkalemia.  Resolved at this time, potassium 4.2.     LOS: 0 Milanie Rosenfield 5/31/20194:01 PM

## 2018-02-25 NOTE — Progress Notes (Signed)
Pulmonary Critical Care Medicine [facility] [place]   PULMONARY SERVICE  PROGRESS NOTE  Date of Service: 02/25/2018  Terri Ellis  ZOX:[id number]  DOB: [birth year]   DOA: 01/24/2018  Referring Physician: Carron Curie, MD  HPI: Terri Ellis is a 62 y.o. female seen for follow up of Acute on Chronic Respiratory Failure.  Patient remains on T collar she is been having some increase in secretions noted.  Patient is also on 40% oxygen now  Medications: Reviewed on Rounds  Physical Exam:  Vitals: Temperature 98.7 pulse 91 respiratory rate 14 blood pressure 114/66 saturations 98%  Ventilator Settings on T collar trials currently 40% FiO2  . General: Comfortable at this time . Eyes: Grossly normal lids, irises & conjunctiva . ENT: grossly tongue is normal . Neck: no obvious mass . Cardiovascular: S1 S2 normal no gallop . Respiratory: Coarse breath sounds no rhonchi . Abdomen: soft . Skin: no rash seen on limited exam . Musculoskeletal: not rigid . Psychiatric:unable to assess . Neurologic: no seizure no involuntary movements         Labs on Admission:  Basic Metabolic Panel: Recent Labs  Lab 02/21/18 0421 02/22/18 0839 02/23/18 0807 02/25/18 0900  NA 133* 137  --  129*  K 4.3 4.1  --  4.2  CL 96*  --   --  93*  CO2 22  --   --  23  GLUCOSE 105* 92  --  100*  BUN 120*  --   --  83*  CREATININE 3.33*  --   --  2.86*  CALCIUM 9.1  --  9.1 9.1  PHOS 6.2*  --   --  6.5*    Liver Function Tests: Recent Labs  Lab 02/21/18 0421 02/25/18 0900  ALBUMIN 2.5* 2.5*   No results for input(s): LIPASE, AMYLASE in the last 168 hours. No results for input(s): AMMONIA in the last 168 hours.  CBC: Recent Labs  Lab 02/20/18 0527 02/21/18 0421 02/22/18 0839 02/23/18 0807 02/25/18 0900  WBC 11.0* 12.6*  --  13.0* 16.1*  HGB 6.8* 7.1* 6.8* 7.8* 6.4*  HCT 21.1* 21.8* 20.0* 23.5* 20.3*  MCV 95.0 93.6  --  91.8 96.7  PLT 185 195  --  177 214     Cardiac Enzymes: No results for input(s): CKTOTAL, CKMB, CKMBINDEX, TROPONINI in the last 168 hours.  BNP (last 3 results) No results for input(s): BNP in the last 8760 hours.  ProBNP (last 3 results) No results for input(s): PROBNP in the last 8760 hours.  Radiological Exams on Admission: No results found.  Assessment/Plan Principal Problem:   Acute on chronic respiratory failure with hypoxia (HCC) Active Problems:   Acute tubular necrosis (HCC)   Lobar pneumonia (HCC)   Severe sepsis with septic shock (HCC)   ARDS (adult respiratory distress syndrome) (HCC)   Acute systolic CHF (congestive heart failure) (HCC)   1. Acute on chronic respiratory failure with hypoxia we will continue with T collar as ordered.  We will follow-up chest x-ray today because of the increased oxygen requirements. 2. Lobar pneumonia follow-up x-ray ordered 3. ARDS has been doing better clinically we will do follow-up x-ray 4. Acute on chronic systolic heart failure continue with pulmonary toilet secretion management. 5. Severe sepsis with shock clinically resolved 6. Acute renal failure followed by nephrology will continue with their recommendations   I have personally seen and evaluated the patient, evaluated laboratory and imaging results, formulated the assessment and plan and placed orders.  The Patient requires high complexity decision making for assessment and support.  Case was discussed on Rounds with the Respiratory Therapy Staff  Allyne Gee, MD [facility] Pulmonary Critical Care Medicine Sleep Medicine

## 2018-02-26 DIAGNOSIS — J80 Acute respiratory distress syndrome: Secondary | ICD-10-CM | POA: Diagnosis not present

## 2018-02-26 DIAGNOSIS — J9621 Acute and chronic respiratory failure with hypoxia: Secondary | ICD-10-CM | POA: Diagnosis not present

## 2018-02-26 DIAGNOSIS — N170 Acute kidney failure with tubular necrosis: Secondary | ICD-10-CM | POA: Diagnosis not present

## 2018-02-26 DIAGNOSIS — I5021 Acute systolic (congestive) heart failure: Secondary | ICD-10-CM | POA: Diagnosis not present

## 2018-02-26 LAB — HEPATITIS B SURFACE ANTIGEN: Hepatitis B Surface Ag: NEGATIVE

## 2018-02-26 NOTE — Progress Notes (Signed)
Pulmonary Critical Care Medicine [facility] [place]   PULMONARY SERVICE  PROGRESS NOTE  Date of Service: 02/26/2018  Terri Ellis  BMW:[id number]  DOB: November 05, 1955   DOA: 01/24/2018  Referring Physician: Carron Curie, MD  HPI: Terri Ellis is a 62 y.o. female seen for follow up of Acute on Chronic Respiratory Failure.  Patient is on 40% oxygen remains on T collar.  She is not been that clear mentally either.  Right now is comfortable without distress.  Still has been having periodic desaturations noted according to the respiratory therapist  Medications: Reviewed on Rounds  Physical Exam:  Vitals: Temperature 98.6 pulse 98 respiratory rate 32 blood pressure 119/68 saturations 96%  Ventilator Settings off the ventilator on T collar right now 40% oxygen  . General: Comfortable at this time . Eyes: Grossly normal lids, irises & conjunctiva . ENT: grossly tongue is normal . Neck: no obvious mass . Cardiovascular: S1 S2 normal no gallop . Respiratory: No rhonchi expansion is equal . Abdomen: soft . Skin: no rash seen on limited exam . Musculoskeletal: not rigid . Psychiatric:unable to assess . Neurologic: no seizure no involuntary movements         Labs on Admission:  Basic Metabolic Panel: Recent Labs  Lab 02/21/18 0421 02/22/18 0839 02/23/18 0807 02/25/18 0900  NA 133* 137  --  129*  K 4.3 4.1  --  4.2  CL 96*  --   --  93*  CO2 22  --   --  23  GLUCOSE 105* 92  --  100*  BUN 120*  --   --  83*  CREATININE 3.33*  --   --  2.86*  CALCIUM 9.1  --  9.1 9.1  PHOS 6.2*  --   --  6.5*    Liver Function Tests: Recent Labs  Lab 02/21/18 0421 02/25/18 0900  ALBUMIN 2.5* 2.5*   No results for input(s): LIPASE, AMYLASE in the last 168 hours. No results for input(s): AMMONIA in the last 168 hours.  CBC: Recent Labs  Lab 02/20/18 0527 02/21/18 0421 02/22/18 0839 02/23/18 0807 02/25/18 0900  WBC 11.0* 12.6*  --  13.0* 16.1*  HGB  6.8* 7.1* 6.8* 7.8* 6.4*  HCT 21.1* 21.8* 20.0* 23.5* 20.3*  MCV 95.0 93.6  --  91.8 96.7  PLT 185 195  --  177 214    Cardiac Enzymes: No results for input(s): CKTOTAL, CKMB, CKMBINDEX, TROPONINI in the last 168 hours.  BNP (last 3 results) No results for input(s): BNP in the last 8760 hours.  ProBNP (last 3 results) No results for input(s): PROBNP in the last 8760 hours.  Radiological Exams on Admission: Dg Chest Port 1 View  Result Date: 02/25/2018 CLINICAL DATA:  62 year old female dialysis patient.  Hypoxia. EXAM: PORTABLE CHEST 1 VIEW COMPARISON:  02/08/2018 and earlier. FINDINGS: Portable AP semi upright view at 1204 hours. Stable tracheostomy. Stable right IJ approach dialysis catheter. Increased bilateral pulmonary interstitial opacity. Mild obscuration of the diaphragm. Stable cardiomegaly and mediastinal contours. No pneumothorax. No large pleural effusion or consolidation. Paucity of bowel gas in the upper abdomen. IMPRESSION: 1. Increased bilateral pulmonary interstitial opacity, favor acute interstitial edema. 2. Lung base hypo ventilation, favor atelectasis. Small pleural effusions possible. Electronically Signed   By: Odessa Fleming M.D.   On: 02/25/2018 12:19    Assessment/Plan Principal Problem:   Acute on chronic respiratory failure with hypoxia (HCC) Active Problems:   Acute tubular necrosis (HCC)   Lobar pneumonia (  HCC)   Severe sepsis with septic shock (HCC)   ARDS (adult respiratory distress syndrome) (HCC)   Acute systolic CHF (congestive heart failure) (HCC)   1. Acute on chronic respiratory failure with hypoxia we will continue with T collar trials continue secretion management pulmonary toilet titrate oxygen as tolerated. 2. Lobar pneumonia treated with antibiotics we will continue to follow 3. Severe sepsis with shock we will continue present therapy 4. ARDS slight increase interstitial infiltrates noted more consistent with edema right now 5. Acute renal  failure may perhaps need to take off more fluid with her next dialysis.  Will discuss with nephrology. 6. Acute systolic heart failure as noted above chest x-ray more consistent with fluid overload   I have personally seen and evaluated the patient, evaluated laboratory and imaging results, formulated the assessment and plan and placed orders. The Patient requires high complexity decision making for assessment and support.  Case was discussed on Rounds with the Respiratory Therapy Staff  Yevonne PaxSaadat A Keawe Marcello, MD [facility] Pulmonary Critical Care Medicine Sleep Medicine

## 2018-02-27 DIAGNOSIS — R6521 Severe sepsis with septic shock: Secondary | ICD-10-CM | POA: Diagnosis not present

## 2018-02-27 DIAGNOSIS — N170 Acute kidney failure with tubular necrosis: Secondary | ICD-10-CM | POA: Diagnosis not present

## 2018-02-27 DIAGNOSIS — A419 Sepsis, unspecified organism: Secondary | ICD-10-CM | POA: Diagnosis not present

## 2018-02-27 DIAGNOSIS — I5021 Acute systolic (congestive) heart failure: Secondary | ICD-10-CM | POA: Diagnosis not present

## 2018-02-27 DIAGNOSIS — J9621 Acute and chronic respiratory failure with hypoxia: Secondary | ICD-10-CM | POA: Diagnosis not present

## 2018-02-27 DIAGNOSIS — J80 Acute respiratory distress syndrome: Secondary | ICD-10-CM | POA: Diagnosis not present

## 2018-02-27 LAB — TYPE AND SCREEN
ABO/RH(D): O POS
ANTIBODY SCREEN: NEGATIVE
UNIT DIVISION: 0
Unit division: 0

## 2018-02-27 LAB — BPAM RBC
BLOOD PRODUCT EXPIRATION DATE: 201906262359
Blood Product Expiration Date: 201906262359
ISSUE DATE / TIME: 201906010057
ISSUE DATE / TIME: 201905311619
Unit Type and Rh: 5100
Unit Type and Rh: 5100

## 2018-02-27 NOTE — Progress Notes (Signed)
Pulmonary Critical Care Medicine [facility] [place]   PULMONARY SERVICE  PROGRESS NOTE  Date of Service: 02/27/2018  Terri Ellis  NFA:[id number]  DOB: [birth year]   DOA: 01/24/2018  Referring Physician: Carron Curie, MD  HPI: Terri Ellis is a 62 y.o. female seen for follow up of Acute on Chronic Respiratory Failure.  Currently on T collar no distress at this time.  Patient's on 45% FiO2 has been tolerating PMV fairly well.  Medications: Reviewed on Rounds  Physical Exam:  Vitals: Temperature 96.9 pulse 83 respiratory rate 21 blood pressure 112/67 saturations 96%  Ventilator Settings aerosolized T collar FiO2 45%  . General: Comfortable at this time . Eyes: Grossly normal lids, irises & conjunctiva . ENT: grossly tongue is normal . Neck: no obvious mass . Cardiovascular: S1 S2 normal no gallop . Respiratory: Coarse breath sounds no rhonchi expansion is equal . Abdomen: soft . Skin: no rash seen on limited exam . Musculoskeletal: not rigid . Psychiatric:unable to assess . Neurologic: no seizure no involuntary movements         Labs on Admission:  Basic Metabolic Panel: Recent Labs  Lab 02/21/18 0421 02/22/18 0839 02/23/18 0807 02/25/18 0900  NA 133* 137  --  129*  K 4.3 4.1  --  4.2  CL 96*  --   --  93*  CO2 22  --   --  23  GLUCOSE 105* 92  --  100*  BUN 120*  --   --  83*  CREATININE 3.33*  --   --  2.86*  CALCIUM 9.1  --  9.1 9.1  PHOS 6.2*  --   --  6.5*    Liver Function Tests: Recent Labs  Lab 02/21/18 0421 02/25/18 0900  ALBUMIN 2.5* 2.5*   No results for input(s): LIPASE, AMYLASE in the last 168 hours. No results for input(s): AMMONIA in the last 168 hours.  CBC: Recent Labs  Lab 02/21/18 0421 02/22/18 0839 02/23/18 0807 02/25/18 0900  WBC 12.6*  --  13.0* 16.1*  HGB 7.1* 6.8* 7.8* 6.4*  HCT 21.8* 20.0* 23.5* 20.3*  MCV 93.6  --  91.8 96.7  PLT 195  --  177 214    Cardiac Enzymes: No results for  input(s): CKTOTAL, CKMB, CKMBINDEX, TROPONINI in the last 168 hours.  BNP (last 3 results) No results for input(s): BNP in the last 8760 hours.  ProBNP (last 3 results) No results for input(s): PROBNP in the last 8760 hours.  Radiological Exams on Admission: Dg Chest Port 1 View  Result Date: 02/25/2018 CLINICAL DATA:  62 year old female dialysis patient.  Hypoxia. EXAM: PORTABLE CHEST 1 VIEW COMPARISON:  02/08/2018 and earlier. FINDINGS: Portable AP semi upright view at 1204 hours. Stable tracheostomy. Stable right IJ approach dialysis catheter. Increased bilateral pulmonary interstitial opacity. Mild obscuration of the diaphragm. Stable cardiomegaly and mediastinal contours. No pneumothorax. No large pleural effusion or consolidation. Paucity of bowel gas in the upper abdomen. IMPRESSION: 1. Increased bilateral pulmonary interstitial opacity, favor acute interstitial edema. 2. Lung base hypo ventilation, favor atelectasis. Small pleural effusions possible. Electronically Signed   By: Odessa Fleming M.D.   On: 02/25/2018 12:19    Assessment/Plan Principal Problem:   Acute on chronic respiratory failure with hypoxia (HCC) Active Problems:   Acute tubular necrosis (HCC)   Lobar pneumonia (HCC)   Severe sepsis with septic shock (HCC)   ARDS (adult respiratory distress syndrome) (HCC)   Acute systolic CHF (congestive heart failure) (HCC)  1. Acute on chronic respiratory failure with hypoxia doing fine with T collar at baseline we will continue secretion management and pulmonary toilet.  Patient's also more awake today she is tolerating the PMV fairly well also. 2. Lobar pneumonia clinically improving we will continue with supportive care last x-ray had shown increased edema which is being managed. 3. Severe sepsis with shock hemodynamically stable we will continue to follow follow 4. ARDS follow clinically FiO2 requirements are little bit high but slowly improving. 5. Acute systolic heart failure  as above x-ray showed some increased pulmonary edema   I have personally seen and evaluated the patient, evaluated laboratory and imaging results, formulated the assessment and plan and placed orders. The Patient requires high complexity decision making for assessment and support.  Case was discussed on Rounds with the Respiratory Therapy Staff  Yevonne PaxSaadat A Khan, MD [facility] Pulmonary Critical Care Medicine Sleep Medicine

## 2018-02-28 DIAGNOSIS — J80 Acute respiratory distress syndrome: Secondary | ICD-10-CM | POA: Diagnosis not present

## 2018-02-28 DIAGNOSIS — J9621 Acute and chronic respiratory failure with hypoxia: Secondary | ICD-10-CM | POA: Diagnosis not present

## 2018-02-28 DIAGNOSIS — I5021 Acute systolic (congestive) heart failure: Secondary | ICD-10-CM | POA: Diagnosis not present

## 2018-02-28 DIAGNOSIS — N170 Acute kidney failure with tubular necrosis: Secondary | ICD-10-CM | POA: Diagnosis not present

## 2018-02-28 LAB — CBC
HEMATOCRIT: 22.5 % — AB (ref 36.0–46.0)
Hemoglobin: 7.2 g/dL — ABNORMAL LOW (ref 12.0–15.0)
MCH: 30.8 pg (ref 26.0–34.0)
MCHC: 32 g/dL (ref 30.0–36.0)
MCV: 96.2 fL (ref 78.0–100.0)
PLATELETS: 213 10*3/uL (ref 150–400)
RBC: 2.34 MIL/uL — AB (ref 3.87–5.11)
RDW: 21.7 % — AB (ref 11.5–15.5)
WBC: 12.8 10*3/uL — ABNORMAL HIGH (ref 4.0–10.5)

## 2018-02-28 LAB — RENAL FUNCTION PANEL
Albumin: 2.4 g/dL — ABNORMAL LOW (ref 3.5–5.0)
Anion gap: 13 (ref 5–15)
BUN: 84 mg/dL — ABNORMAL HIGH (ref 6–20)
CHLORIDE: 92 mmol/L — AB (ref 101–111)
CO2: 22 mmol/L (ref 22–32)
CREATININE: 2.96 mg/dL — AB (ref 0.44–1.00)
Calcium: 9 mg/dL (ref 8.9–10.3)
GFR calc non Af Amer: 16 mL/min — ABNORMAL LOW (ref >60)
GFR, EST AFRICAN AMERICAN: 18 mL/min — AB (ref >60)
Glucose, Bld: 128 mg/dL — ABNORMAL HIGH (ref 65–99)
POTASSIUM: 4.7 mmol/L (ref 3.5–5.1)
Phosphorus: 7.2 mg/dL — ABNORMAL HIGH (ref 2.5–4.6)
Sodium: 127 mmol/L — ABNORMAL LOW (ref 135–145)

## 2018-02-28 NOTE — Progress Notes (Signed)
[facility] [place]   PULMONARY SERVICE  PROGRESS NOTE  Date of Service: 02/28/2018  Terri LigasLillian Patterson Ellis  WUJ:[id number]  DOB: 1956/02/10   DOA: 01/24/2018  Referring Physician: Carron CurieAli Hijazi, MD  HPI: Terri Ellis is a 62 y.o. female seen for follow up of Acute on Chronic Respiratory Failure.  Currently patient's on T collar is been on 35% oxygen with good saturations.  Comfortable right now without distress.  Medications: Reviewed on Rounds  Physical Exam:  Vitals: Temperature is 97.7 pulse 92 respiratory 22 blood pressure 122/76 saturation 96%  Ventilator Settings off the ventilator on T collar  . General: Comfortable at this time . Eyes: Grossly normal lids, irises & conjunctiva . ENT: grossly tongue is normal . Neck: no obvious mass . Cardiovascular: S1 S2 normal no gallop . Respiratory: No rhonchi expansion is equal . Abdomen: soft . Skin: no rash seen on limited exam . Musculoskeletal: not rigid . Psychiatric:unable to assess . Neurologic: no seizure no involuntary movements         Lab Data:   Basic Metabolic Panel: Recent Labs  Lab 02/22/18 0839 02/23/18 0807 02/25/18 0900 02/28/18 0533  NA 137  --  129* 127*  K 4.1  --  4.2 4.7  CL  --   --  93* 92*  CO2  --   --  23 22  GLUCOSE 92  --  100* 128*  BUN  --   --  83* 84*  CREATININE  --   --  2.86* 2.96*  CALCIUM  --  9.1 9.1 9.0  PHOS  --   --  6.5* 7.2*    Liver Function Tests: Recent Labs  Lab 02/25/18 0900 02/28/18 0533  ALBUMIN 2.5* 2.4*   No results for input(s): LIPASE, AMYLASE in the last 168 hours. No results for input(s): AMMONIA in the last 168 hours.  CBC: Recent Labs  Lab 02/22/18 0839 02/23/18 0807 02/25/18 0900 02/28/18 0533  WBC  --  13.0* 16.1* 12.8*  HGB 6.8* 7.8* 6.4* 7.2*  HCT 20.0* 23.5* 20.3* 22.5*  MCV  --  91.8 96.7 96.2  PLT  --  177 214 213    Cardiac Enzymes: No results for input(s): CKTOTAL,  CKMB, CKMBINDEX, TROPONINI in the last 168 hours.  BNP (last 3 results) No results for input(s): BNP in the last 8760 hours.  ProBNP (last 3 results) No results for input(s): PROBNP in the last 8760 hours.  Radiological Exams: No results found.  Assessment/Plan Principal Problem:   Acute on chronic respiratory failure with hypoxia (HCC) Active Problems:   Acute tubular necrosis (HCC)   Lobar pneumonia (HCC)   Severe sepsis with septic shock (HCC)   ARDS (adult respiratory distress syndrome) (HCC)   Acute systolic CHF (congestive heart failure) (HCC)   1. Acute on chronic respiratory failure with hypoxia we will continue with T collar trials continue pulmonary toilet secretion management overall patient prognosis guarded 2. Lobar pneumonia treated with antibiotics 3. Acute tubular necrosis renal failure patient is followed by nephrology has been doing fine with dialysis 4. Severe sepsis with shock clinically improved hemodynamically stable 5. ARDS follow-up x-rays 6. Acute systolic heart failure monitor fluid status closely as mentioned previously   I have personally seen and evaluated the patient, evaluated laboratory and imaging results, formulated the assessment and plan and placed orders. The Patient requires high complexity decision making for assessment and support.  Case was discussed on Rounds with the Respiratory  Therapy Staff  Allyne Gee, MD [facility] Pulmonary Critical Care Medicine Sleep Medicine

## 2018-02-28 NOTE — Progress Notes (Signed)
[name]  ROUNDING NOTE   Subjective:  Patient seen at bedside. Appears to be in good spirits. Due for dialysis again later today.   Objective:  Vital signs in last 24 hours:  Temperature 97.7 pulse 92 respirations 20 blood pressure 121/76  Physical Exam: General: Chronically ill appearing  Head: Normocephalic, atraumatic. Moist oral mucosal membranes  Eyes: Anicteric  Neck: Tracheostomy in place, capped  Lungs:  Bilateral rhonchi, normal effort  Heart: S1S2 no rubs  Abdomen:  Soft, nontender, PEG  Extremities: 1+ peripheral edema.  Neurologic: Awake, alert, follows simple commands  Skin: No lesions  Access: Right internal jugular PermCath, LUE AVF    Basic Metabolic Panel: Recent Labs  Lab 02/22/18 0839 02/23/18 0807 02/25/18 0900 02/28/18 0533  NA 137  --  129* 127*  K 4.1  --  4.2 4.7  CL  --   --  93* 92*  CO2  --   --  23 22  GLUCOSE 92  --  100* 128*  BUN  --   --  83* 84*  CREATININE  --   --  2.86* 2.96*  CALCIUM  --  9.1 9.1 9.0  PHOS  --   --  6.5* 7.2*    Liver Function Tests: Recent Labs  Lab 02/25/18 0900 02/28/18 0533  ALBUMIN 2.5* 2.4*   No results for input(s): LIPASE, AMYLASE in the last 168 hours. No results for input(s): AMMONIA in the last 168 hours.  CBC: Recent Labs  Lab 02/22/18 0839 02/23/18 0807 02/25/18 0900 02/28/18 0533  WBC  --  13.0* 16.1* 12.8*  HGB 6.8* 7.8* 6.4* 7.2*  HCT 20.0* 23.5* 20.3* 22.5*  MCV  --  91.8 96.7 96.2  PLT  --  177 214 213    Cardiac Enzymes: No results for input(s): CKTOTAL, CKMB, CKMBINDEX, TROPONINI in the last 168 hours.  BNP: Invalid input(s): POCBNP  CBG: No results for input(s): GLUCAP in the last 168 hours.  Microbiology: Results for orders placed or performed during the hospital encounter of 01/24/18  Culture, Urine     Status: None   Collection Time: 01/28/18  3:00 AM  Result Value Ref Range Status   Specimen Description URINE, RANDOM  Final   Special  Requests NONE  Final   Culture   Final    NO GROWTH Performed at [facility], [address] [place], [place] [address]    Report Status 01/29/2018 FINAL  Final    Coagulation Studies: No results for input(s): LABPROT, INR in the last 72 hours.  Urinalysis: No results for input(s): COLORURINE, LABSPEC, PHURINE, GLUCOSEU, HGBUR, BILIRUBINUR, KETONESUR, PROTEINUR, UROBILINOGEN, NITRITE, LEUKOCYTESUR in the last 72 hours.  Invalid input(s): APPERANCEUR    Imaging: No results found.   Medications:       Assessment/ Plan:  62 y.o. female with a PMHx of congestive heart failure, chronic kidney disease stage III, acute respiratory failure status post tracheostomy placement, hypertension, iron deficiency anemia, diabetes mellitus type 2, hyperlipidemia, obesity, who was admitted to [facility] on 01/24/2018 for ongoing treatment of pneumonia, acute respiratory failure, acute renal failure requiring hemodialysis.  1.  Acute renal failure/chronic kidney disease stage III baseline creatinine 1.8.  Patient was seen by nephrology at the outside hospital.  She was initially started on CRRT and subsequently transitioned to intermittent hemodialysis. LUE AVF placed. -Patient remains dialysis dependent at this time.  We will plan for hemodialysis today.  Thereafter her next dialysis treatment will be on  Wednesday.  2.  Acute respiratory failure.  The patient's tracheostomy remains capped and well-functioning.  3.  Anemia of chronic kidney disease.  Hemoglobin up to 7.2 posttransfusion.  4.  Secondary hyperparathyroidism.  Phosphorus up to 7.2 this a.m. but should come down with dialysis treatments.  5.  Hyperkalemia.  Potassium normalized at 4.7.     LOS: 0 Harol Shabazz 6/3/20198:40 AM

## 2018-03-01 LAB — HEPATITIS B SURFACE ANTIGEN: HEP B S AG: NEGATIVE

## 2018-03-02 DIAGNOSIS — J9621 Acute and chronic respiratory failure with hypoxia: Secondary | ICD-10-CM | POA: Diagnosis not present

## 2018-03-02 DIAGNOSIS — I5021 Acute systolic (congestive) heart failure: Secondary | ICD-10-CM | POA: Diagnosis not present

## 2018-03-02 DIAGNOSIS — N170 Acute kidney failure with tubular necrosis: Secondary | ICD-10-CM | POA: Diagnosis not present

## 2018-03-02 DIAGNOSIS — A419 Sepsis, unspecified organism: Secondary | ICD-10-CM | POA: Diagnosis not present

## 2018-03-02 DIAGNOSIS — R6521 Severe sepsis with septic shock: Secondary | ICD-10-CM | POA: Diagnosis not present

## 2018-03-02 DIAGNOSIS — J80 Acute respiratory distress syndrome: Secondary | ICD-10-CM | POA: Diagnosis not present

## 2018-03-02 LAB — RENAL FUNCTION PANEL
ALBUMIN: 2.5 g/dL — AB (ref 3.5–5.0)
ANION GAP: 14 (ref 5–15)
BUN: 78 mg/dL — AB (ref 6–20)
CO2: 17 mmol/L — ABNORMAL LOW (ref 22–32)
CREATININE: 2.54 mg/dL — AB (ref 0.44–1.00)
Calcium: 8.9 mg/dL (ref 8.9–10.3)
Chloride: 100 mmol/L — ABNORMAL LOW (ref 101–111)
GFR calc Af Amer: 22 mL/min — ABNORMAL LOW (ref >60)
GFR calc non Af Amer: 19 mL/min — ABNORMAL LOW (ref >60)
GLUCOSE: 122 mg/dL — AB (ref 65–99)
PHOSPHORUS: 5.7 mg/dL — AB (ref 2.5–4.6)
Potassium: 4.8 mmol/L (ref 3.5–5.1)
SODIUM: 131 mmol/L — AB (ref 135–145)

## 2018-03-02 LAB — CBC
HCT: 20.3 % — ABNORMAL LOW (ref 36.0–46.0)
HEMATOCRIT: 25.2 % — AB (ref 36.0–46.0)
HEMATOCRIT: 23.9 % — AB (ref 36.0–46.0)
HEMOGLOBIN: 6.6 g/dL — AB (ref 12.0–15.0)
HEMOGLOBIN: 8.3 g/dL — AB (ref 12.0–15.0)
Hemoglobin: 7.8 g/dL — ABNORMAL LOW (ref 12.0–15.0)
MCH: 31 pg (ref 26.0–34.0)
MCH: 31 pg (ref 26.0–34.0)
MCH: 31.1 pg (ref 26.0–34.0)
MCHC: 32.5 g/dL (ref 30.0–36.0)
MCHC: 32.9 g/dL (ref 30.0–36.0)
MCHC: 32.6 g/dL (ref 30.0–36.0)
MCV: 95.3 fL (ref 78.0–100.0)
MCV: 94 fL (ref 78.0–100.0)
MCV: 95.2 fL (ref 78.0–100.0)
PLATELETS: 236 10*3/uL (ref 150–400)
Platelets: 216 10*3/uL (ref 150–400)
Platelets: ADEQUATE 10*3/uL (ref 150–400)
RBC: 2.13 MIL/uL — ABNORMAL LOW (ref 3.87–5.11)
RBC: 2.68 MIL/uL — AB (ref 3.87–5.11)
RBC: 2.51 MIL/uL — ABNORMAL LOW (ref 3.87–5.11)
RDW: 23.3 % — AB (ref 11.5–15.5)
RDW: 22.2 % — ABNORMAL HIGH (ref 11.5–15.5)
RDW: 22.3 % — AB (ref 11.5–15.5)
WBC: 13.4 10*3/uL — ABNORMAL HIGH (ref 4.0–10.5)
WBC: 15.5 10*3/uL — ABNORMAL HIGH (ref 4.0–10.5)
WBC: 15.4 10*3/uL — ABNORMAL HIGH (ref 4.0–10.5)

## 2018-03-02 LAB — PREPARE RBC (CROSSMATCH)

## 2018-03-02 LAB — LIPASE, BLOOD: Lipase: 29 U/L (ref 11–51)

## 2018-03-02 NOTE — Progress Notes (Signed)
[facility]  ROUNDING NOTE   Subjective:  Patient seen at bedside. She is due for dialysis today. Appears a bit more short of breath today.   Objective:  Vital signs in last 24 hours:  Pulse 96 respirations 25 blood pressure 167/35  Physical Exam: General: Chronically ill appearing  Head: Normocephalic, atraumatic. Moist oral mucosal membranes  Eyes: Anicteric  Neck: Tracheostomy in place, capped  Lungs:  Bilateral rhonchi, normal effort  Heart: S1S2 no rubs  Abdomen:  Soft, nontender, PEG  Extremities: 1+ peripheral edema.  Neurologic: Awake, alert, follows simple commands  Skin: No lesions  Access: Right internal jugular PermCath, LUE AVF    Basic Metabolic Panel: Recent Labs  Lab 02/25/18 0900 02/28/18 0533 03/02/18 0628  NA 129* 127* 131*  K 4.2 4.7 4.8  CL 93* 92* 100*  CO2 23 22 17*  GLUCOSE 100* 128* 122*  BUN 83* 84* 78*  CREATININE 2.86* 2.96* 2.54*  CALCIUM 9.1 9.0 8.9  PHOS 6.5* 7.2* 5.7*    Liver Function Tests: Recent Labs  Lab 02/25/18 0900 02/28/18 0533 03/02/18 0628  ALBUMIN 2.5* 2.4* 2.5*   No results for input(s): LIPASE, AMYLASE in the last 168 hours. No results for input(s): AMMONIA in the last 168 hours.  CBC: Recent Labs  Lab 02/25/18 0900 02/28/18 0533 03/02/18 0628  WBC 16.1* 12.8* 13.4*  HGB 6.4* 7.2* 6.6*  HCT 20.3* 22.5* 20.3*  MCV 96.7 96.2 95.3  PLT 214 213 216    Cardiac Enzymes: No results for input(s): CKTOTAL, CKMB, CKMBINDEX, TROPONINI in the last 168 hours.  BNP: Invalid input(s): POCBNP  CBG: No results for input(s): GLUCAP in the last 168 hours.  Microbiology: Results for orders placed or performed during the hospital encounter of 01/24/18  Culture, Urine     Status: None   Collection Time: 01/28/18  3:00 AM  Result Value Ref Range Status   Specimen Description URINE, RANDOM  Final   Special Requests NONE  Final   Culture   Final    NO GROWTH Performed at [facility],  [address] [place], [place] [address]    Report Status 01/29/2018 FINAL  Final    Coagulation Studies: No results for input(s): LABPROT, INR in the last 72 hours.  Urinalysis: No results for input(s): COLORURINE, LABSPEC, PHURINE, GLUCOSEU, HGBUR, BILIRUBINUR, KETONESUR, PROTEINUR, UROBILINOGEN, NITRITE, LEUKOCYTESUR in the last 72 hours.  Invalid input(s): APPERANCEUR    Imaging: No results found.   Medications:       Assessment/ Plan:  62 y.o. female with a PMHx of congestive heart failure, chronic kidney disease stage III, acute respiratory failure status post tracheostomy placement, hypertension, iron deficiency anemia, diabetes mellitus type 2, hyperlipidemia, obesity, who was admitted to [facility] on 01/24/2018 for ongoing treatment of pneumonia, acute respiratory failure, acute renal failure requiring hemodialysis.  1.  Acute renal failure/chronic kidney disease stage III baseline creatinine 1.8.  Patient was seen by nephrology at the outside hospital.  She was initially started on CRRT and subsequently transitioned to intermittent hemodialysis. LUE AVF placed. -Patient due for hemodialysis today.  Orders have been prepared.  Thereafter next dialysis treatment on Friday.  2.  Acute respiratory failure.  Patient has done well over the course of the hospitalization in regards to acute respiratory failure.  Her tracheostomy remains capped at this time.  3.  Anemia of chronic kidney disease.  Hemoglobin has dropped again today to 6.6.  Consider blood transfusion but defer to hospitalist.  4.  Secondary hyperparathyroidism.  Serum phosphorus now down to 5.7.  Continue to monitor.  5.  Hyperkalemia.  Potassium acceptable at 4.8.     LOS: 0 Terri Ellis 6/5/201911:23 AM

## 2018-03-02 NOTE — Progress Notes (Signed)
Pulmonary Critical Care [facility] [place]   PULMONARY SERVICE  PROGRESS NOTE  Date of Service: 03/02/2018  Terri Ellis  WUJ:[id number]  DOB: [birth year]   DOA: 01/24/2018  Referring Physician: Carron CurieAli Hijazi, MD  HPI: Terri Ellis is a 62 y.o. female seen for follow up of Acute on Chronic Respiratory Failure.  She has back to capping trials doing fairly well has been requiring 6 L oxygen however  Medications: Reviewed on Rounds  Physical Exam:  Vitals: Temperature 96.6 pulse 97 respiratory rate 20 blood pressure 168/70 saturations 96%  Ventilator Settings capping 6 L  . General: Comfortable at this time . Eyes: Grossly normal lids, irises & conjunctiva . ENT: grossly tongue is normal . Neck: no obvious mass . Cardiovascular: S1 S2 normal no gallop . Respiratory: No rhonchi expansion is equal . Abdomen: soft . Skin: no rash seen on limited exam . Musculoskeletal: not rigid . Psychiatric:unable to assess . Neurologic: no seizure no involuntary movements         Lab Data:   Basic Metabolic Panel: Recent Labs  Lab 02/25/18 0900 02/28/18 0533 03/02/18 0628  NA 129* 127* 131*  K 4.2 4.7 4.8  CL 93* 92* 100*  CO2 23 22 17*  GLUCOSE 100* 128* 122*  BUN 83* 84* 78*  CREATININE 2.86* 2.96* 2.54*  CALCIUM 9.1 9.0 8.9  PHOS 6.5* 7.2* 5.7*    Liver Function Tests: Recent Labs  Lab 02/25/18 0900 02/28/18 0533 03/02/18 0628  ALBUMIN 2.5* 2.4* 2.5*   No results for input(s): LIPASE, AMYLASE in the last 168 hours. No results for input(s): AMMONIA in the last 168 hours.  CBC: Recent Labs  Lab 02/25/18 0900 02/28/18 0533 03/02/18 0628  WBC 16.1* 12.8* 13.4*  HGB 6.4* 7.2* 6.6*  HCT 20.3* 22.5* 20.3*  MCV 96.7 96.2 95.3  PLT 214 213 216    Cardiac Enzymes: No results for input(s): CKTOTAL, CKMB, CKMBINDEX, TROPONINI in the last 168 hours.  BNP (last 3 results) No results for input(s): BNP in the last 8760  hours.  ProBNP (last 3 results) No results for input(s): PROBNP in the last 8760 hours.  Radiological Exams: No results found.  Assessment/Plan Principal Problem:   Acute on chronic respiratory failure with hypoxia (HCC) Active Problems:   Acute tubular necrosis (HCC)   Lobar pneumonia (HCC)   Severe sepsis with septic shock (HCC)   ARDS (adult respiratory distress syndrome) (HCC)   Acute systolic CHF (congestive heart failure) (HCC)   1. Acute on chronic respiratory failure with hypoxia continue with capping as ordered I am hopeful we can continue with 24 hours.  Continue secretion management pulmonary toilet patient's overall prognosis guarded. 2. Lobar pneumonia treated with antibiotics we will follow along 3. Severe sepsis with shock stable we will monitor 4. ARDS clinically resolved improving 5. Acute systolic heart failure right now is compensated need to monitor her fluid status closely. 6. End-stage renal disease on dialysis   I have personally seen and evaluated the patient, evaluated laboratory and imaging results, formulated the assessment and plan and placed orders. The Patient requires high complexity decision making for assessment and support.  Case was discussed on Rounds with the Respiratory Therapy Staff  Yevonne PaxSaadat A Omarri Eich, MD [facility] Pulmonary Critical Care Medicine Sleep Medicine

## 2018-03-03 ENCOUNTER — Other Ambulatory Visit (HOSPITAL_COMMUNITY): Payer: Self-pay

## 2018-03-03 DIAGNOSIS — N170 Acute kidney failure with tubular necrosis: Secondary | ICD-10-CM | POA: Diagnosis not present

## 2018-03-03 DIAGNOSIS — J80 Acute respiratory distress syndrome: Secondary | ICD-10-CM | POA: Diagnosis not present

## 2018-03-03 DIAGNOSIS — J9621 Acute and chronic respiratory failure with hypoxia: Secondary | ICD-10-CM | POA: Diagnosis not present

## 2018-03-03 DIAGNOSIS — I5021 Acute systolic (congestive) heart failure: Secondary | ICD-10-CM | POA: Diagnosis not present

## 2018-03-03 LAB — CBC
HCT: 22.2 % — ABNORMAL LOW (ref 36.0–46.0)
HEMATOCRIT: 22.5 % — AB (ref 36.0–46.0)
HEMATOCRIT: 22.4 % — AB (ref 36.0–46.0)
HEMOGLOBIN: 7.3 g/dL — AB (ref 12.0–15.0)
Hemoglobin: 7.2 g/dL — ABNORMAL LOW (ref 12.0–15.0)
Hemoglobin: 7 g/dL — ABNORMAL LOW (ref 12.0–15.0)
MCH: 30.9 pg (ref 26.0–34.0)
MCH: 30.9 pg (ref 26.0–34.0)
MCH: 30.4 pg (ref 26.0–34.0)
MCHC: 32.4 g/dL (ref 30.0–36.0)
MCHC: 32.1 g/dL (ref 30.0–36.0)
MCHC: 31.5 g/dL (ref 30.0–36.0)
MCV: 95.3 fL (ref 78.0–100.0)
MCV: 96.1 fL (ref 78.0–100.0)
MCV: 96.5 fL (ref 78.0–100.0)
PLATELETS: 234 10*3/uL (ref 150–400)
Platelets: 220 10*3/uL (ref 150–400)
Platelets: 231 10*3/uL (ref 150–400)
RBC: 2.36 MIL/uL — ABNORMAL LOW (ref 3.87–5.11)
RBC: 2.33 MIL/uL — ABNORMAL LOW (ref 3.87–5.11)
RBC: 2.3 MIL/uL — AB (ref 3.87–5.11)
RDW: 22.6 % — AB (ref 11.5–15.5)
RDW: 22.7 % — ABNORMAL HIGH (ref 11.5–15.5)
RDW: 23 % — AB (ref 11.5–15.5)
WBC: 13.8 10*3/uL — ABNORMAL HIGH (ref 4.0–10.5)
WBC: 13.8 10*3/uL — ABNORMAL HIGH (ref 4.0–10.5)
WBC: 13.1 10*3/uL — ABNORMAL HIGH (ref 4.0–10.5)

## 2018-03-03 LAB — COMPREHENSIVE METABOLIC PANEL
ALK PHOS: 770 U/L — AB (ref 38–126)
ALT: 304 U/L — AB (ref 14–54)
ANION GAP: 14 (ref 5–15)
AST: 139 U/L — ABNORMAL HIGH (ref 15–41)
Albumin: 2.4 g/dL — ABNORMAL LOW (ref 3.5–5.0)
BILIRUBIN TOTAL: 5.7 mg/dL — AB (ref 0.3–1.2)
BUN: 41 mg/dL — ABNORMAL HIGH (ref 6–20)
CALCIUM: 9.3 mg/dL (ref 8.9–10.3)
CO2: 24 mmol/L (ref 22–32)
CREATININE: 1.91 mg/dL — AB (ref 0.44–1.00)
Chloride: 98 mmol/L — ABNORMAL LOW (ref 101–111)
GFR, EST AFRICAN AMERICAN: 31 mL/min — AB (ref >60)
GFR, EST NON AFRICAN AMERICAN: 27 mL/min — AB (ref >60)
Glucose, Bld: 123 mg/dL — ABNORMAL HIGH (ref 65–99)
Potassium: 4.6 mmol/L (ref 3.5–5.1)
SODIUM: 136 mmol/L (ref 135–145)
TOTAL PROTEIN: 6.1 g/dL — AB (ref 6.5–8.1)

## 2018-03-03 LAB — OCCULT BLOOD X 1 CARD TO LAB, STOOL: FECAL OCCULT BLD: POSITIVE — AB

## 2018-03-03 NOTE — Progress Notes (Signed)
[facility]   PULMONARY SERVICE  PROGRESS NOTE  Date of Service: 03/03/2018  Terri Ellis  WUJ:[id number]  DOB: [birth year]   DOA: 01/24/2018  Referring Physician: Carron CurieAli Hijazi, MD  HPI: Terri Ellis is a 62 y.o. female seen for follow up of Acute on Chronic Respiratory Failure.  Doing well with capping trials right now has been tolerating fairly well has been on 5 L nasal cannula  Medications: Reviewed on Rounds  Physical Exam:  Vitals: Temperature 98.0 pulse 97 respiratory 23 blood pressure 140/77 saturation 97%  Ventilator Settings capping trials on 5 L  . General: Comfortable at this time . Eyes: Grossly normal lids, irises & conjunctiva . ENT: grossly tongue is normal . Neck: no obvious mass . Cardiovascular: S1 S2 normal no gallop . Respiratory: No rhonchi expansion is equal . Abdomen: soft . Skin: no rash seen on limited exam . Musculoskeletal: not rigid . Psychiatric:unable to assess . Neurologic: no seizure no involuntary movements         Lab Data:   Basic Metabolic Panel: Recent Labs  Lab 02/25/18 0900 02/28/18 0533 03/02/18 0628 03/03/18 0505  NA 129* 127* 131* 136  K 4.2 4.7 4.8 4.6  CL 93* 92* 100* 98*  CO2 23 22 17* 24  GLUCOSE 100* 128* 122* 123*  BUN 83* 84* 78* 41*  CREATININE 2.86* 2.96* 2.54* 1.91*  CALCIUM 9.1 9.0 8.9 9.3  PHOS 6.5* 7.2* 5.7*  --     Liver Function Tests: Recent Labs  Lab 02/25/18 0900 02/28/18 0533 03/02/18 0628 03/03/18 0505  AST  --   --   --  139*  ALT  --   --   --  304*  ALKPHOS  --   --   --  770*  BILITOT  --   --   --  5.7*  PROT  --   --   --  6.1*  ALBUMIN 2.5* 2.4* 2.5* 2.4*   Recent Labs  Lab 03/02/18 1815  LIPASE 29   No results for input(s): AMMONIA in the last 168 hours.  CBC: Recent Labs  Lab 03/02/18 0628 03/02/18 1815 03/02/18 2208 03/03/18 0505 03/03/18 1007  WBC 13.4* 15.5* 15.4* 13.8* 13.8*  HGB 6.6*  8.3* 7.8* 7.3* 7.2*  HCT 20.3* 25.2* 23.9* 22.5* 22.4*  MCV 95.3 94.0 95.2 95.3 96.1  PLT 216 PLATELET CLUMPS NOTED ON SMEAR, COUNT APPEARS ADEQUATE 236 220 231    Cardiac Enzymes: No results for input(s): CKTOTAL, CKMB, CKMBINDEX, TROPONINI in the last 168 hours.  BNP (last 3 results) No results for input(s): BNP in the last 8760 hours.  ProBNP (last 3 results) No results for input(s): PROBNP in the last 8760 hours.  Radiological Exams: No results found.  Assessment/Plan Principal Problem:   Acute on chronic respiratory failure with hypoxia (HCC) Active Problems:   Acute tubular necrosis (HCC)   Lobar pneumonia (HCC)   Severe sepsis with septic shock (HCC)   ARDS (adult respiratory distress syndrome) (HCC)   Acute systolic CHF (congestive heart failure) (HCC)   1. Acute on chronic respiratory failure with hypoxia we will continue with capping continue supportive care patient is doing fairly well at this time. 2. Acute renal failure right now is stable followed by nephrology for dialysis. 3. Lobar pneumonia treated with antibiotics 4. ARDS stable at this time will follow 5. Acute systolic heart failure stable we will continue with present management   I have personally seen  and evaluated the patient, evaluated laboratory and imaging results, formulated the assessment and plan and placed orders. The Patient requires high complexity decision making for assessment and support.  Case was discussed on Rounds with the Respiratory Therapy Staff  Allyne Gee, MD [facility] Pulmonary Critical Care Medicine Sleep Medicine

## 2018-03-04 DIAGNOSIS — N170 Acute kidney failure with tubular necrosis: Secondary | ICD-10-CM | POA: Diagnosis not present

## 2018-03-04 DIAGNOSIS — I5021 Acute systolic (congestive) heart failure: Secondary | ICD-10-CM | POA: Diagnosis not present

## 2018-03-04 DIAGNOSIS — J80 Acute respiratory distress syndrome: Secondary | ICD-10-CM | POA: Diagnosis not present

## 2018-03-04 DIAGNOSIS — A419 Sepsis, unspecified organism: Secondary | ICD-10-CM | POA: Diagnosis not present

## 2018-03-04 DIAGNOSIS — J9621 Acute and chronic respiratory failure with hypoxia: Secondary | ICD-10-CM | POA: Diagnosis not present

## 2018-03-04 DIAGNOSIS — R6521 Severe sepsis with septic shock: Secondary | ICD-10-CM | POA: Diagnosis not present

## 2018-03-04 DIAGNOSIS — J181 Lobar pneumonia, unspecified organism: Secondary | ICD-10-CM | POA: Diagnosis not present

## 2018-03-04 LAB — CBC
HCT: 21.6 % — ABNORMAL LOW (ref 36.0–46.0)
HEMOGLOBIN: 6.9 g/dL — AB (ref 12.0–15.0)
MCH: 30.9 pg (ref 26.0–34.0)
MCHC: 31.9 g/dL (ref 30.0–36.0)
MCV: 96.9 fL (ref 78.0–100.0)
Platelets: 237 10*3/uL (ref 150–400)
RBC: 2.23 MIL/uL — AB (ref 3.87–5.11)
RDW: 23.1 % — ABNORMAL HIGH (ref 11.5–15.5)
WBC: 13.5 10*3/uL — ABNORMAL HIGH (ref 4.0–10.5)

## 2018-03-04 LAB — RENAL FUNCTION PANEL
ANION GAP: 12 (ref 5–15)
Albumin: 2.4 g/dL — ABNORMAL LOW (ref 3.5–5.0)
BUN: 57 mg/dL — ABNORMAL HIGH (ref 6–20)
CALCIUM: 9.2 mg/dL (ref 8.9–10.3)
CO2: 23 mmol/L (ref 22–32)
Chloride: 97 mmol/L — ABNORMAL LOW (ref 101–111)
Creatinine, Ser: 2.36 mg/dL — ABNORMAL HIGH (ref 0.44–1.00)
GFR, EST AFRICAN AMERICAN: 24 mL/min — AB (ref >60)
GFR, EST NON AFRICAN AMERICAN: 21 mL/min — AB (ref >60)
Glucose, Bld: 90 mg/dL (ref 65–99)
Phosphorus: 5.9 mg/dL — ABNORMAL HIGH (ref 2.5–4.6)
Potassium: 5.1 mmol/L (ref 3.5–5.1)
SODIUM: 132 mmol/L — AB (ref 135–145)

## 2018-03-04 LAB — PREPARE RBC (CROSSMATCH)

## 2018-03-04 NOTE — Progress Notes (Signed)
[facility]   PULMONARY SERVICE  PROGRESS NOTE  Date of Service: 03/04/2018  Terri Ellis  ZOX:[id number]  DOB: 17-May-1956   DOA: 01/24/2018  Referring Physician: Carron CurieAli Hijazi, MD  HPI: Terri LigasLillian Patterson Ellis is a 62 y.o. female seen for follow up of Acute on Chronic Respiratory Failure.  Currently is on 28% FiO2 no distress.  On T collar tolerating well.  She has been capping was kept overnight off of the capping trials  Medications: Reviewed on Rounds  Physical Exam:  Vitals: Temperature 98.4 pulse 95 rest rate 19 blood pressure 133/63 saturations 94%  Ventilator Settings on T collar will continue  . General: Comfortable at this time . Eyes: Grossly normal lids, irises & conjunctiva . ENT: grossly tongue is normal . Neck: no obvious mass . Cardiovascular: S1 S2 normal no gallop . Respiratory: Good aeration noted bilaterally . Abdomen: soft . Skin: no rash seen on limited exam . Musculoskeletal: not rigid . Psychiatric:unable to assess . Neurologic: no seizure no involuntary movements         Lab Data:   Basic Metabolic Panel: Recent Labs  Lab 02/28/18 0533 03/02/18 0628 03/03/18 0505 03/04/18 0445  NA 127* 131* 136 132*  K 4.7 4.8 4.6 5.1  CL 92* 100* 98* 97*  CO2 22 17* 24 23  GLUCOSE 128* 122* 123* 90  BUN 84* 78* 41* 57*  CREATININE 2.96* 2.54* 1.91* 2.36*  CALCIUM 9.0 8.9 9.3 9.2  PHOS 7.2* 5.7*  --  5.9*    Liver Function Tests: Recent Labs  Lab 02/28/18 0533 03/02/18 0628 03/03/18 0505 03/04/18 0445  AST  --   --  139*  --   ALT  --   --  304*  --   ALKPHOS  --   --  770*  --   BILITOT  --   --  5.7*  --   PROT  --   --  6.1*  --   ALBUMIN 2.4* 2.5* 2.4* 2.4*   Recent Labs  Lab 03/02/18 1815  LIPASE 29   No results for input(s): AMMONIA in the last 168 hours.  CBC: Recent Labs  Lab 03/02/18 2208 03/03/18 0505 03/03/18 1007 03/03/18 1626 03/04/18 0445  WBC 15.4*  13.8* 13.8* 13.1* 13.5*  HGB 7.8* 7.3* 7.2* 7.0* 6.9*  HCT 23.9* 22.5* 22.4* 22.2* 21.6*  MCV 95.2 95.3 96.1 96.5 96.9  PLT 236 220 231 234 237    Cardiac Enzymes: No results for input(s): CKTOTAL, CKMB, CKMBINDEX, TROPONINI in the last 168 hours.  BNP (last 3 results) No results for input(s): BNP in the last 8760 hours.  ProBNP (last 3 results) No results for input(s): PROBNP in the last 8760 hours.  Radiological Exams: [place] Abdomen Limited Ruq  Result Date: 03/03/2018 CLINICAL DATA:  62 year old female with RIGHT UPPER quadrant abdominal pain for 2 days. EXAM: ULTRASOUND ABDOMEN LIMITED RIGHT UPPER QUADRANT COMPARISON:  None. FINDINGS: Gallbladder: Multiple mobile gallstones are identified, the largest measuring 9 mm. Gallbladder sludge is present. No gallbladder wall thickening, pericholecystic fluid or sonographic Murphy sign noted. Common bile duct: Diameter: 3.4 mm. No intrahepatic or extrahepatic biliary dilatation. Liver: No focal lesion identified. Within normal limits in parenchymal echogenicity. Portal vein is patent on color Doppler imaging with normal direction of blood flow towards the liver. IMPRESSION: 1. Cholelithiasis without evidence of acute cholecystitis. 2. Unremarkable liver.  No biliary dilatation. Electronically Signed   By: Harmon PierJeffrey  Hu M.D.   On:  03/03/2018 16:12    Assessment/Plan Principal Problem:   Acute on chronic respiratory failure with hypoxia (HCC) Active Problems:   Acute tubular necrosis (HCC)   Lobar pneumonia (HCC)   Severe sepsis with septic shock (HCC)   ARDS (adult respiratory distress syndrome) (HCC)   Acute systolic CHF (congestive heart failure) (HCC)   1. Acute on chronic respiratory failure with hypoxia we will continue with T collar trials continue aggressive pulmonary toilet patient is tolerating capping so we will resume this now 2. Acute renal failure followed by nephrology for dialysis 3. Antibody is clinically improved we will  monitor 4. Acute systolic heart failure follow fluid status closely 5. Lobar pneumonia treated 6. Severe sepsis with shock hemodynamically stable continue to monitor pressures   I have personally seen and evaluated the patient, evaluated laboratory and imaging results, formulated the assessment and plan and placed orders. The Patient requires high complexity decision making for assessment and support.  Case was discussed on Rounds with the Respiratory Therapy Staff  Yevonne Pax, MD [facility] Pulmonary Critical Care Medicine Sleep Medicine

## 2018-03-04 NOTE — Progress Notes (Signed)
[facility]  ROUNDING NOTE   Subjective:  Patient seen at bedside. Tracheostomy currently uncapped. Due for dialysis later today.   Objective:  Vital signs in last 24 hours:  Temperature 97.4 pulse 95 respiration 20 blood pressure 133/63  Physical Exam: General: Chronically ill appearing  Head: Normocephalic, atraumatic. Moist oral mucosal membranes  Eyes: Anicteric  Neck: Tracheostomy in place, uncapped  Lungs:  Bilateral rhonchi, normal effort  Heart: S1S2 no rubs  Abdomen:  Soft, nontender, PEG  Extremities: 1+ peripheral edema.  Neurologic: Awake, alert, follows simple commands  Skin: No lesions  Access: Right internal jugular PermCath, LUE AVF    Basic Metabolic Panel: Recent Labs  Lab 02/25/18 0900 02/28/18 0533 03/02/18 0628 03/03/18 0505 03/04/18 0445  NA 129* 127* 131* 136 132*  K 4.2 4.7 4.8 4.6 5.1  CL 93* 92* 100* 98* 97*  CO2 23 22 17* 24 23  GLUCOSE 100* 128* 122* 123* 90  BUN 83* 84* 78* 41* 57*  CREATININE 2.86* 2.96* 2.54* 1.91* 2.36*  CALCIUM 9.1 9.0 8.9 9.3 9.2  PHOS 6.5* 7.2* 5.7*  --  5.9*    Liver Function Tests: Recent Labs  Lab 02/25/18 0900 02/28/18 0533 03/02/18 0628 03/03/18 0505 03/04/18 0445  AST  --   --   --  139*  --   ALT  --   --   --  304*  --   ALKPHOS  --   --   --  770*  --   BILITOT  --   --   --  5.7*  --   PROT  --   --   --  6.1*  --   ALBUMIN 2.5* 2.4* 2.5* 2.4* 2.4*   Recent Labs  Lab 03/02/18 1815  LIPASE 29   No results for input(s): AMMONIA in the last 168 hours.  CBC: Recent Labs  Lab 03/02/18 2208 03/03/18 0505 03/03/18 1007 03/03/18 1626 03/04/18 0445  WBC 15.4* 13.8* 13.8* 13.1* 13.5*  HGB 7.8* 7.3* 7.2* 7.0* 6.9*  HCT 23.9* 22.5* 22.4* 22.2* 21.6*  MCV 95.2 95.3 96.1 96.5 96.9  PLT 236 220 231 234 237    Cardiac Enzymes: No results for input(s): CKTOTAL, CKMB, CKMBINDEX, TROPONINI in the last 168 hours.  BNP: Invalid input(s): POCBNP  CBG: No results for  input(s): GLUCAP in the last 168 hours.  Microbiology: Results for orders placed or performed during the hospital encounter of 01/24/18  Culture, Urine     Status: None   Collection Time: 01/28/18  3:00 AM  Result Value Ref Range Status   Specimen Description URINE, RANDOM  Final   Special Requests NONE  Final   Culture   Final    NO GROWTH Performed at [facility], [address] [place], [place] [address]    Report Status 01/29/2018 FINAL  Final    Coagulation Studies: No results for input(s): LABPROT, INR in the last 72 hours.  Urinalysis: No results for input(s): COLORURINE, LABSPEC, PHURINE, GLUCOSEU, HGBUR, BILIRUBINUR, KETONESUR, PROTEINUR, UROBILINOGEN, NITRITE, LEUKOCYTESUR in the last 72 hours.  Invalid input(s): APPERANCEUR    Imaging: US Abdomen Limited Ruq  Result Date: 03/03/2018 CLINICAL DATA:  62 year old female with RIGHT UPPER quadrant abdominal pain for 2 days. EXAM: ULTRASOUND ABDOMEN LIMITED RIGHT UPPER QUADRANT COMPARISON:  None. FINDINGS: Gallbladder: Multiple mobile gallstones are identified, the largest measuring 9 mm. Gallbladder sludge is present. No gallbladder wall thickening, pericholecystic fluid or sonographic Murphy sign noted. Common bile duct: Diameter: 3.4  mm. No intrahepatic or extrahepatic biliary dilatation. Liver: No focal lesion identified. Within normal limits in parenchymal echogenicity. Portal vein is patent on color Doppler imaging with normal direction of blood flow towards the liver. IMPRESSION: 1. Cholelithiasis without evidence of acute cholecystitis. 2. Unremarkable liver.  No biliary dilatation. Electronically Signed   By: Harmon PierJeffrey  Hu M.D.   On: 03/03/2018 16:12     Medications:       Assessment/ Plan:  62 y.o. female with a PMHx of congestive heart failure, chronic kidney disease stage III, acute respiratory failure status post tracheostomy placement, hypertension, iron deficiency anemia, diabetes mellitus type 2,  hyperlipidemia, obesity, who was admitted to [facility] on 01/24/2018 for ongoing treatment of pneumonia, acute respiratory failure, acute renal failure requiring hemodialysis.  1.  Acute renal failure/chronic kidney disease stage III baseline creatinine 1.8.  Patient was seen by nephrology at the outside hospital.  She was initially started on CRRT and subsequently transitioned to intermittent hemodialysis. LUE AVF placed. -We will plan for hemodialysis today.  Orders have been prepared.  Thereafter her next dialysis treatment will be on Monday.  She remains dialysis dependent at the moment.  2.  Acute respiratory failure.  Tracheostomy currently uncapped.  Pulmonary/critical care following her case.  3.  Anemia of chronic kidney disease.  Hemoglobin down to 6.9 again today.  Consider blood transfusion.  Defer to hospitalist.  4.  Secondary hyperparathyroidism.  Serum phosphorus currently 5.9.  This should come down with dialysis today.  5.  Hyperkalemia.  Potassium high normal at 5.1.  Continue potassium bath with dialysis.     LOS: 0 Terri Ellis 6/7/20198:41 AM

## 2018-03-05 DIAGNOSIS — J80 Acute respiratory distress syndrome: Secondary | ICD-10-CM | POA: Diagnosis not present

## 2018-03-05 DIAGNOSIS — N170 Acute kidney failure with tubular necrosis: Secondary | ICD-10-CM | POA: Diagnosis not present

## 2018-03-05 DIAGNOSIS — I5021 Acute systolic (congestive) heart failure: Secondary | ICD-10-CM | POA: Diagnosis not present

## 2018-03-05 DIAGNOSIS — J9621 Acute and chronic respiratory failure with hypoxia: Secondary | ICD-10-CM | POA: Diagnosis not present

## 2018-03-05 LAB — TYPE AND SCREEN
ABO/RH(D): O POS
Antibody Screen: NEGATIVE
UNIT DIVISION: 0
UNIT DIVISION: 0

## 2018-03-05 LAB — CBC
HCT: 23.2 % — ABNORMAL LOW (ref 36.0–46.0)
HEMATOCRIT: 23.2 % — AB (ref 36.0–46.0)
HEMOGLOBIN: 7.3 g/dL — AB (ref 12.0–15.0)
Hemoglobin: 7.4 g/dL — ABNORMAL LOW (ref 12.0–15.0)
MCH: 31.8 pg (ref 26.0–34.0)
MCH: 30.8 pg (ref 26.0–34.0)
MCHC: 31.9 g/dL (ref 30.0–36.0)
MCHC: 31.5 g/dL (ref 30.0–36.0)
MCV: 99.6 fL (ref 78.0–100.0)
MCV: 97.9 fL (ref 78.0–100.0)
PLATELETS: 227 10*3/uL (ref 150–400)
Platelets: 245 10*3/uL (ref 150–400)
RBC: 2.33 MIL/uL — AB (ref 3.87–5.11)
RBC: 2.37 MIL/uL — AB (ref 3.87–5.11)
RDW: 22.3 % — ABNORMAL HIGH (ref 11.5–15.5)
RDW: 22.5 % — ABNORMAL HIGH (ref 11.5–15.5)
WBC: 13.1 10*3/uL — AB (ref 4.0–10.5)
WBC: 13.3 10*3/uL — ABNORMAL HIGH (ref 4.0–10.5)

## 2018-03-05 LAB — BPAM RBC
Blood Product Expiration Date: 201907052359
Blood Product Expiration Date: 201907062359
ISSUE DATE / TIME: 201906071255
ISSUE DATE / TIME: 201906051301
UNIT TYPE AND RH: 5100
Unit Type and Rh: 5100

## 2018-03-05 NOTE — Progress Notes (Signed)
Pulmonary Critical Care Medicine [facility] [place]   PULMONARY SERVICE  PROGRESS NOTE  Date of Service: 03/05/2018  Terri Ellis  WUJ:[id number]  DOB: [birth year]   DOA: 01/24/2018  Referring Physician: Carron Curie, MD  HPI: Terri Ellis is a 62 y.o. female seen for follow up of Acute on Chronic Respiratory Failure.  Resting comfortably without distress patient's on the PMV at this time with the T bar  Medications: Reviewed on Rounds  Physical Exam:  Vitals: Temperature 98.2 pulse 64 respiratory rate 22 blood pressure 138/65 saturations 94%  Ventilator Settings off the ventilator on T collar 28% FiO2  . General: Comfortable at this time . Eyes: Grossly normal lids, irises & conjunctiva . ENT: grossly tongue is normal . Neck: no obvious mass . Cardiovascular: S1 S2 normal no gallop . Respiratory: No rhonchi are noted at this time . Abdomen: soft . Skin: no rash seen on limited exam . Musculoskeletal: not rigid . Psychiatric:unable to assess . Neurologic: no seizure no involuntary movements         Lab Data:   Basic Metabolic Panel: Recent Labs  Lab 02/28/18 0533 03/02/18 0628 03/03/18 0505 03/04/18 0445  NA 127* 131* 136 132*  K 4.7 4.8 4.6 5.1  CL 92* 100* 98* 97*  CO2 22 17* 24 23  GLUCOSE 128* 122* 123* 90  BUN 84* 78* 41* 57*  CREATININE 2.96* 2.54* 1.91* 2.36*  CALCIUM 9.0 8.9 9.3 9.2  PHOS 7.2* 5.7*  --  5.9*    Liver Function Tests: Recent Labs  Lab 02/28/18 0533 03/02/18 0628 03/03/18 0505 03/04/18 0445  AST  --   --  139*  --   ALT  --   --  304*  --   ALKPHOS  --   --  770*  --   BILITOT  --   --  5.7*  --   PROT  --   --  6.1*  --   ALBUMIN 2.4* 2.5* 2.4* 2.4*   Recent Labs  Lab 03/02/18 1815  LIPASE 29   No results for input(s): AMMONIA in the last 168 hours.  CBC: Recent Labs  Lab 03/03/18 0505 03/03/18 1007 03/03/18 1626 03/04/18 0445 03/05/18 0429  WBC 13.8* 13.8* 13.1* 13.5* 13.1*   HGB 7.3* 7.2* 7.0* 6.9* 7.4*  HCT 22.5* 22.4* 22.2* 21.6* 23.2*  MCV 95.3 96.1 96.5 96.9 99.6  PLT 220 231 234 237 227    Cardiac Enzymes: No results for input(s): CKTOTAL, CKMB, CKMBINDEX, TROPONINI in the last 168 hours.  BNP (last 3 results) No results for input(s): BNP in the last 8760 hours.  ProBNP (last 3 results) No results for input(s): PROBNP in the last 8760 hours.  Radiological Exams: US Abdomen Limited Ruq  Result Date: 03/03/2018 CLINICAL DATA:  62 year old female with RIGHT UPPER quadrant abdominal pain for 2 days. EXAM: ULTRASOUND ABDOMEN LIMITED RIGHT UPPER QUADRANT COMPARISON:  None. FINDINGS: Gallbladder: Multiple mobile gallstones are identified, the largest measuring 9 mm. Gallbladder sludge is present. No gallbladder wall thickening, pericholecystic fluid or sonographic Murphy sign noted. Common bile duct: Diameter: 3.4 mm. No intrahepatic or extrahepatic biliary dilatation. Liver: No focal lesion identified. Within normal limits in parenchymal echogenicity. Portal vein is patent on color Doppler imaging with normal direction of blood flow towards the liver. IMPRESSION: 1. Cholelithiasis without evidence of acute cholecystitis. 2. Unremarkable liver.  No biliary dilatation. Electronically Signed   By: Harmon Pier M.D.   On: 03/03/2018 16:12  Assessment/Plan Principal Problem:   Acute on chronic respiratory failure with hypoxia (HCC) Active Problems:   Acute tubular necrosis (HCC)   Lobar pneumonia (HCC)   Severe sepsis with septic shock (HCC)   ARDS (adult respiratory distress syndrome) (HCC)   Acute systolic CHF (congestive heart failure) (HCC)   1. Acute on chronic respiratory failure with hypoxia we will continue with T collar and advance to capping as she has been doing previously.  Continue with pulmonary toilet secretion management. 2. Acute renal failure followed by nephrology will continue present management. 3. Lobar pneumonia treated with  antibiotics we will continue present management. 4. Severe sepsis with shock hemodynamically stable 5. ARDS clinically resolved 6. Acute systolic heart failure monitor fluid   I have personally seen and evaluated the patient, evaluated laboratory and imaging results, formulated the assessment and plan and placed orders. The Patient requires high complexity decision making for assessment and support.  Case was discussed on Rounds with the Respiratory Therapy Staff  Yevonne PaxSaadat A Khan, MD [facility] Pulmonary Critical Care Medicine Sleep Medicine

## 2018-03-06 DIAGNOSIS — J9621 Acute and chronic respiratory failure with hypoxia: Secondary | ICD-10-CM | POA: Diagnosis not present

## 2018-03-06 DIAGNOSIS — J181 Lobar pneumonia, unspecified organism: Secondary | ICD-10-CM | POA: Diagnosis not present

## 2018-03-06 DIAGNOSIS — J80 Acute respiratory distress syndrome: Secondary | ICD-10-CM | POA: Diagnosis not present

## 2018-03-06 DIAGNOSIS — R6521 Severe sepsis with septic shock: Secondary | ICD-10-CM | POA: Diagnosis not present

## 2018-03-06 DIAGNOSIS — A419 Sepsis, unspecified organism: Secondary | ICD-10-CM | POA: Diagnosis not present

## 2018-03-06 DIAGNOSIS — N170 Acute kidney failure with tubular necrosis: Secondary | ICD-10-CM | POA: Diagnosis not present

## 2018-03-06 DIAGNOSIS — I5021 Acute systolic (congestive) heart failure: Secondary | ICD-10-CM | POA: Diagnosis not present

## 2018-03-06 NOTE — Progress Notes (Signed)
[facility] Critical [facility]   PULMONARY SERVICE  PROGRESS NOTE  Date of Service: 03/06/2018  Terri Ellis  [id number]  DOB: May 08, 1956   DOA: 01/24/2018  Referring Physician: Carron CurieAli Hijazi, MD  HPI: Terri Ellis is a 62 y.o. female seen for follow up of Acute on Chronic Respiratory Failure.  Patient's been tolerating the PMV fairly well on the T collar so far looks good.  Has not been Yet  Medications: Reviewed on Rounds  Physical Exam:  Vitals: Temperature 97.3 pulse 97 respiratory rate 25 blood pressure 157/78 saturations 94%  Ventilator Settings aerosolized T collar FiO2 28%  . General: Comfortable at this time . Eyes: Grossly normal lids, irises & conjunctiva . ENT: grossly tongue is normal . Neck: no obvious mass . Cardiovascular: S1 S2 normal no gallop . Respiratory: Good aeration no rhonchi noted . Abdomen: soft . Skin: no rash seen on limited exam . Musculoskeletal: not rigid . Psychiatric:unable to assess . Neurologic: no seizure no involuntary movements         Lab Data:   Basic Metabolic Panel: Recent Labs  Lab 02/28/18 0533 03/02/18 0628 03/03/18 0505 03/04/18 0445  NA 127* 131* 136 132*  K 4.7 4.8 4.6 5.1  CL 92* 100* 98* 97*  CO2 22 17* 24 23  GLUCOSE 128* 122* 123* 90  BUN 84* 78* 41* 57*  CREATININE 2.96* 2.54* 1.91* 2.36*  CALCIUM 9.0 8.9 9.3 9.2  PHOS 7.2* 5.7*  --  5.9*    Liver Function Tests: Recent Labs  Lab 02/28/18 0533 03/02/18 0628 03/03/18 0505 03/04/18 0445  AST  --   --  139*  --   ALT  --   --  304*  --   ALKPHOS  --   --  770*  --   BILITOT  --   --  5.7*  --   PROT  --   --  6.1*  --   ALBUMIN 2.4* 2.5* 2.4* 2.4*   Recent Labs  Lab 03/02/18 1815  LIPASE 29   No results for input(s): AMMONIA in the last 168 hours.  CBC: Recent Labs  Lab 03/03/18 1007 03/03/18 1626 03/04/18 0445 03/05/18 0429 03/05/18 1422  WBC 13.8* 13.1* 13.5* 13.1* 13.3*   HGB 7.2* 7.0* 6.9* 7.4* 7.3*  HCT 22.4* 22.2* 21.6* 23.2* 23.2*  MCV 96.1 96.5 96.9 99.6 97.9  PLT 231 234 237 227 245    Cardiac Enzymes: No results for input(s): CKTOTAL, CKMB, CKMBINDEX, TROPONINI in the last 168 hours.  BNP (last 3 results) No results for input(s): BNP in the last 8760 hours.  ProBNP (last 3 results) No results for input(s): PROBNP in the last 8760 hours.  Radiological Exams: No results found.  Assessment/Plan Principal Problem:   Acute on chronic respiratory failure with hypoxia (HCC) Active Problems:   Acute tubular necrosis (HCC)   Lobar pneumonia (HCC)   Severe sepsis with septic shock (HCC)   ARDS (adult respiratory distress syndrome) (HCC)   Acute systolic CHF (congestive heart failure) (HCC)   1. Acute on chronic respiratory failure with hypoxia we will continue with T collar at this time.  I would like to advance her once again to capping if able to tolerate continue secretion management pulmonary toilet. 2. Acute renal failure on dialysis followed by nephrology 3. Lobar pneumonia treated we will continue to follow 4. Sepsis resolved 5. ARDS clinically resolved 6. Acute systolic heart failure resolved we will continue to monitor her  fluid status closely   I have personally seen and evaluated the patient, evaluated laboratory and imaging results, formulated the assessment and plan and placed orders. The Patient requires high complexity decision making for assessment and support.  Case was discussed on Rounds with the Respiratory Therapy Staff  Yevonne Pax, MD [facility] Pulmonary Critical Care Medicine Sleep Medicine

## 2018-03-07 DIAGNOSIS — N170 Acute kidney failure with tubular necrosis: Secondary | ICD-10-CM | POA: Diagnosis not present

## 2018-03-07 DIAGNOSIS — I5021 Acute systolic (congestive) heart failure: Secondary | ICD-10-CM | POA: Diagnosis not present

## 2018-03-07 DIAGNOSIS — J80 Acute respiratory distress syndrome: Secondary | ICD-10-CM | POA: Diagnosis not present

## 2018-03-07 DIAGNOSIS — J9621 Acute and chronic respiratory failure with hypoxia: Secondary | ICD-10-CM | POA: Diagnosis not present

## 2018-03-07 LAB — RENAL FUNCTION PANEL
Albumin: 2.3 g/dL — ABNORMAL LOW (ref 3.5–5.0)
Anion gap: 12 (ref 5–15)
BUN: 63 mg/dL — AB (ref 6–20)
CHLORIDE: 94 mmol/L — AB (ref 101–111)
CO2: 25 mmol/L (ref 22–32)
Calcium: 9.5 mg/dL (ref 8.9–10.3)
Creatinine, Ser: 2.86 mg/dL — ABNORMAL HIGH (ref 0.44–1.00)
GFR calc Af Amer: 19 mL/min — ABNORMAL LOW (ref >60)
GFR calc non Af Amer: 17 mL/min — ABNORMAL LOW (ref >60)
GLUCOSE: 111 mg/dL — AB (ref 65–99)
POTASSIUM: 4.7 mmol/L (ref 3.5–5.1)
Phosphorus: 6.2 mg/dL — ABNORMAL HIGH (ref 2.5–4.6)
Sodium: 131 mmol/L — ABNORMAL LOW (ref 135–145)

## 2018-03-07 LAB — BLOOD GAS, ARTERIAL
Acid-Base Excess: 4.3 mmol/L — ABNORMAL HIGH (ref 0.0–2.0)
Bicarbonate: 28 mmol/L (ref 20.0–28.0)
FIO2: 40
O2 SAT: 81.5 %
PCO2 ART: 39.7 mmHg (ref 32.0–48.0)
PO2 ART: 44 mmHg — AB (ref 83.0–108.0)
Patient temperature: 98.6
pH, Arterial: 7.463 — ABNORMAL HIGH (ref 7.350–7.450)

## 2018-03-07 NOTE — Progress Notes (Signed)
Pulmonary Critical Care Medicine [facility] [place]   PULMONARY SERVICE  PROGRESS NOTE  Date of Service: 03/07/2018  Terri Ellis  ZOX:[id number]  DOB: 09/20/1956   DOA: 01/24/2018  Referring Physician: Carron Curie, MD  HPI: Terri Ellis is a 62 y.o. female seen for follow up of Acute on Chronic Respiratory Failure.  Patient right nose on T collar has been on 20% oxygen.  The patient has not been tolerating capping and she is also refusing BiPAP which was the original plan  Medications: Reviewed on Rounds  Physical Exam:  Vitals: Temperature 97.8 pulse 95 respiratory rate 19 blood pressure 117/65 saturations 98%  Ventilator Settings off of the ventilator on T collar 28%  . General: Comfortable at this time . Eyes: Grossly normal lids, irises & conjunctiva . ENT: grossly tongue is normal . Neck: no obvious mass . Cardiovascular: S1 S2 normal no gallop . Respiratory: No rhonchi expansion is equal . Abdomen: soft . Skin: no rash seen on limited exam . Musculoskeletal: not rigid . Psychiatric:unable to assess . Neurologic: no seizure no involuntary movements         Lab Data:   Basic Metabolic Panel: Recent Labs  Lab 03/02/18 0628 03/03/18 0505 03/04/18 0445 03/07/18 1155  NA 131* 136 132* 131*  K 4.8 4.6 5.1 4.7  CL 100* 98* 97* 94*  CO2 17* 24 23 25   GLUCOSE 122* 123* 90 111*  BUN 78* 41* 57* 63*  CREATININE 2.54* 1.91* 2.36* 2.86*  CALCIUM 8.9 9.3 9.2 9.5  PHOS 5.7*  --  5.9* 6.2*    Liver Function Tests: Recent Labs  Lab 03/02/18 0628 03/03/18 0505 03/04/18 0445 03/07/18 1155  AST  --  139*  --   --   ALT  --  304*  --   --   ALKPHOS  --  770*  --   --   BILITOT  --  5.7*  --   --   PROT  --  6.1*  --   --   ALBUMIN 2.5* 2.4* 2.4* 2.3*   Recent Labs  Lab 03/02/18 1815  LIPASE 29   No results for input(s): AMMONIA in the last 168 hours.  CBC: Recent Labs  Lab 03/03/18 1626 03/04/18 0445 03/05/18 0429  03/05/18 1422 03/07/18 1155  WBC 13.1* 13.5* 13.1* 13.3* 13.1*  HGB 7.0* 6.9* 7.4* 7.3* 6.7*  HCT 22.2* 21.6* 23.2* 23.2* 21.0*  MCV 96.5 96.9 99.6 97.9 98.6  PLT 234 237 227 245 252    Cardiac Enzymes: No results for input(s): CKTOTAL, CKMB, CKMBINDEX, TROPONINI in the last 168 hours.  BNP (last 3 results) No results for input(s): BNP in the last 8760 hours.  ProBNP (last 3 results) No results for input(s): PROBNP in the last 8760 hours.  Radiological Exams: No results found.  Assessment/Plan Principal Problem:   Acute on chronic respiratory failure with hypoxia (HCC) Active Problems:   Acute tubular necrosis (HCC)   Lobar pneumonia (HCC)   Severe sepsis with septic shock (HCC)   ARDS (adult respiratory distress syndrome) (HCC)   Acute systolic CHF (congestive heart failure) (HCC)   1. Acute on chronic respiratory failure with hypoxia we will continue weaning T collar.  Encourage compliance with recommended therapy encourage compliance with BiPAP prognosis is guarded. 2. Acute tubular necrosis with renal failure followed by nephrology for dialysis 3. Lobar pneumonia treated 4. Severe sepsis hemodynamically stable out of shock 5. ARDS clinically improving 6. Acute systolic heart failure we  will continue with present management   I have personally seen and evaluated the patient, evaluated laboratory and imaging results, formulated the assessment and plan and placed orders. The Patient requires high complexity decision making for assessment and support.  Case was discussed on Rounds with the Respiratory Therapy Staff  Yevonne PaxSaadat A Madilynne Mullan, MD [facility] Pulmonary Critical Care Medicine Sleep Medicine

## 2018-03-07 NOTE — Progress Notes (Signed)
[facility]  ROUNDING NOTE   Subjective:  Patient resting comfortably in bed at the moment. Her tracheostomy is currently capped. Did not use CPAP overnight. Due for hemodialysis later today. Urine output was only 600 cc over the preceding 24 hours.   Objective:  Vital signs in last 24 hours:  Temperature 97.8 pulse 25 respirations 18 blood pressure 117/63  Physical Exam: General: Chronically ill appearing  Head: Normocephalic, atraumatic. Moist oral mucosal membranes  Eyes: Anicteric  Neck: Tracheostomy in place, capped  Lungs:  Bilateral rhonchi, normal effort  Heart: S1S2 no rubs  Abdomen:  Soft, nontender, PEG  Extremities: 1+ peripheral edema.  Neurologic: Awake, alert, follows simple commands  Skin: No lesions  Access: Right internal jugular PermCath, LUE AVF    Basic Metabolic Panel: Recent Labs  Lab 03/02/18 0628 03/03/18 0505 03/04/18 0445  NA 131* 136 132*  K 4.8 4.6 5.1  CL 100* 98* 97*  CO2 17* 24 23  GLUCOSE 122* 123* 90  BUN 78* 41* 57*  CREATININE 2.54* 1.91* 2.36*  CALCIUM 8.9 9.3 9.2  PHOS 5.7*  --  5.9*    Liver Function Tests: Recent Labs  Lab 03/02/18 0628 03/03/18 0505 03/04/18 0445  AST  --  139*  --   ALT  --  304*  --   ALKPHOS  --  770*  --   BILITOT  --  5.7*  --   PROT  --  6.1*  --   ALBUMIN 2.5* 2.4* 2.4*   Recent Labs  Lab 03/02/18 1815  LIPASE 29   No results for input(s): AMMONIA in the last 168 hours.  CBC: Recent Labs  Lab 03/03/18 1007 03/03/18 1626 03/04/18 0445 03/05/18 0429 03/05/18 1422  WBC 13.8* 13.1* 13.5* 13.1* 13.3*  HGB 7.2* 7.0* 6.9* 7.4* 7.3*  HCT 22.4* 22.2* 21.6* 23.2* 23.2*  MCV 96.1 96.5 96.9 99.6 97.9  PLT 231 234 237 227 245    Cardiac Enzymes: No results for input(s): CKTOTAL, CKMB, CKMBINDEX, TROPONINI in the last 168 hours.  BNP: Invalid input(s): POCBNP  CBG: No results for input(s): GLUCAP in the last 168 hours.  Microbiology: Results for orders placed or  performed during the hospital encounter of 01/24/18  Culture, Urine     Status: None   Collection Time: 01/28/18  3:00 AM  Result Value Ref Range Status   Specimen Description URINE, RANDOM  Final   Special Requests NONE  Final   Culture   Final    NO GROWTH Performed at [facility], [address], [place], [place] [address]    Report Status 01/29/2018 FINAL  Final    Coagulation Studies: No results for input(s): LABPROT, INR in the last 72 hours.  Urinalysis: No results for input(s): COLORURINE, LABSPEC, PHURINE, GLUCOSEU, HGBUR, BILIRUBINUR, KETONESUR, PROTEINUR, UROBILINOGEN, NITRITE, LEUKOCYTESUR in the last 72 hours.  Invalid input(s): APPERANCEUR    Imaging: No results found.   Medications:       Assessment/ Plan:  62 y.o. female with a PMHx of congestive heart failure, chronic kidney disease stage III, acute respiratory failure status post tracheostomy placement, hypertension, iron deficiency anemia, diabetes mellitus type 2, hyperlipidemia, obesity, who was admitted to [facility] on 01/24/2018 for ongoing treatment of pneumonia, acute respiratory failure, acute renal failure requiring hemodialysis.  1.  Acute renal failure/chronic kidney disease stage III baseline creatinine 1.8.  Patient was seen by nephrology at the outside hospital.  She was initially started on CRRT at outside  hospital and subsequently transitioned to intermittent hemodialysis. LUE AVF placed. -Urine output was only 600 cc over the preceding 24 hours.  Therefore we will plan for hemodialysis again later today.  Continue dialysis on Monday, Wednesday, Friday schedule.  2.  Acute respiratory failure.  At the moment the patient's tracheostomy appears to be capped.  She is breathing comfortably.  She declined CPAP overnight.  3.  Anemia of chronic kidney disease.  Hemoglobin currently 7.3.  No urgent indication for transfusion but consider if hemoglobin drops again to 7 or less.  4.   Secondary hyperparathyroidism.   Recheck serum phosphorus today.  5.  Hyperkalemia.  Recheck serum potassium today as most recent serum potassium was 5.1.     LOS: 0 Terri Ellis 6/10/20198:32 AM

## 2018-03-08 ENCOUNTER — Other Ambulatory Visit (HOSPITAL_COMMUNITY): Payer: Self-pay

## 2018-03-08 DIAGNOSIS — I5021 Acute systolic (congestive) heart failure: Secondary | ICD-10-CM | POA: Diagnosis not present

## 2018-03-08 DIAGNOSIS — J80 Acute respiratory distress syndrome: Secondary | ICD-10-CM | POA: Diagnosis not present

## 2018-03-08 DIAGNOSIS — J9621 Acute and chronic respiratory failure with hypoxia: Secondary | ICD-10-CM | POA: Diagnosis not present

## 2018-03-08 DIAGNOSIS — N170 Acute kidney failure with tubular necrosis: Secondary | ICD-10-CM | POA: Diagnosis not present

## 2018-03-08 LAB — BPAM RBC
BLOOD PRODUCT EXPIRATION DATE: 201907072359
ISSUE DATE / TIME: 201906101833
Unit Type and Rh: 5100

## 2018-03-08 LAB — CBC
HEMATOCRIT: 21 % — AB (ref 36.0–46.0)
HEMATOCRIT: 25.3 % — AB (ref 36.0–46.0)
Hemoglobin: 6.7 g/dL — CL (ref 12.0–15.0)
Hemoglobin: 8.2 g/dL — ABNORMAL LOW (ref 12.0–15.0)
MCH: 31.5 pg (ref 26.0–34.0)
MCH: 30.8 pg (ref 26.0–34.0)
MCHC: 31.9 g/dL (ref 30.0–36.0)
MCHC: 32.4 g/dL (ref 30.0–36.0)
MCV: 98.6 fL (ref 78.0–100.0)
MCV: 95.1 fL (ref 78.0–100.0)
PLATELETS: 257 10*3/uL (ref 150–400)
Platelets: 252 10*3/uL (ref 150–400)
RBC: 2.13 MIL/uL — ABNORMAL LOW (ref 3.87–5.11)
RBC: 2.66 MIL/uL — AB (ref 3.87–5.11)
RDW: 22.5 % — AB (ref 11.5–15.5)
RDW: 23.9 % — AB (ref 11.5–15.5)
WBC: 13.1 10*3/uL — ABNORMAL HIGH (ref 4.0–10.5)
WBC: 14.7 10*3/uL — AB (ref 4.0–10.5)

## 2018-03-08 LAB — TYPE AND SCREEN
ABO/RH(D): O POS
Antibody Screen: NEGATIVE
UNIT DIVISION: 0

## 2018-03-08 LAB — PREPARE RBC (CROSSMATCH)

## 2018-03-08 NOTE — Progress Notes (Signed)
Pulmonary Critical Care Medicine [facility] [place]   PULMONARY SERVICE  PROGRESS NOTE  Date of Service: 03/08/2018  Lynnelle Mesmer  ZOX:[id number]  DOB: 09/19/1956   DOA: 01/24/2018  Referring Physician: Carron Curie, MD  HPI: Terri Ellis is a 62 y.o. female seen for follow up of Acute on Chronic Respiratory Failure.  Patient has been on 35% oxygen currently on T collar no distress is noted at this time.  Medications: Reviewed on Rounds  Physical Exam:  Vitals: Temperature 96.9 pulse 93 respiratory rate 21 blood pressure 146/74 saturations 99%  Ventilator Settings off of the ventilator on T collar  . General: Comfortable at this time . Eyes: Grossly normal lids, irises & conjunctiva . ENT: grossly tongue is normal . Neck: no obvious mass . Cardiovascular: S1 S2 normal no gallop . Respiratory: Good air entry no rhonchi noted . Abdomen: soft . Skin: no rash seen on limited exam . Musculoskeletal: not rigid . Psychiatric:unable to assess . Neurologic: no seizure no involuntary movements         Lab Data:   Basic Metabolic Panel: Recent Labs  Lab 03/02/18 0628 03/03/18 0505 03/04/18 0445 03/07/18 1155  NA 131* 136 132* 131*  K 4.8 4.6 5.1 4.7  CL 100* 98* 97* 94*  CO2 17* 24 23 25   GLUCOSE 122* 123* 90 111*  BUN 78* 41* 57* 63*  CREATININE 2.54* 1.91* 2.36* 2.86*  CALCIUM 8.9 9.3 9.2 9.5  PHOS 5.7*  --  5.9* 6.2*    Liver Function Tests: Recent Labs  Lab 03/02/18 0628 03/03/18 0505 03/04/18 0445 03/07/18 1155  AST  --  139*  --   --   ALT  --  304*  --   --   ALKPHOS  --  770*  --   --   BILITOT  --  5.7*  --   --   PROT  --  6.1*  --   --   ALBUMIN 2.5* 2.4* 2.4* 2.3*   Recent Labs  Lab 03/02/18 1815  LIPASE 29   No results for input(s): AMMONIA in the last 168 hours.  CBC: Recent Labs  Lab 03/03/18 1626 03/04/18 0445 03/05/18 0429 03/05/18 1422 03/07/18 1155  WBC 13.1* 13.5* 13.1* 13.3* 13.1*  HGB  7.0* 6.9* 7.4* 7.3* 6.7*  HCT 22.2* 21.6* 23.2* 23.2* 21.0*  MCV 96.5 96.9 99.6 97.9 98.6  PLT 234 237 227 245 252    Cardiac Enzymes: No results for input(s): CKTOTAL, CKMB, CKMBINDEX, TROPONINI in the last 168 hours.  BNP (last 3 results) No results for input(s): BNP in the last 8760 hours.  ProBNP (last 3 results) No results for input(s): PROBNP in the last 8760 hours.  Radiological Exams: Dg Chest Port 1 View  Result Date: 03/08/2018 CLINICAL DATA:  Tracheostomy EXAM: PORTABLE CHEST 1 VIEW COMPARISON:  02/25/2018 FINDINGS: Right dialysis catheter and tracheostomy remain in place, unchanged. Cardiomegaly with vascular congestion. Diffuse interstitial and alveolar airspace opacities compatible with edema/CHF. Probable small layering effusions. IMPRESSION: Mild to moderate CHF.  Small layering effusions.  No real change. Electronically Signed   By: Charlett Nose M.D.   On: 03/08/2018 09:50    Assessment/Plan Principal Problem:   Acute on chronic respiratory failure with hypoxia (HCC) Active Problems:   Acute tubular necrosis (HCC)   Lobar pneumonia (HCC)   Severe sepsis with septic shock (HCC)   ARDS (adult respiratory distress syndrome) (HCC)   Acute systolic CHF (congestive heart failure) (HCC)  1. Acute on chronic respiratory failure with hypoxia we will continue weaning O2, continue secretion management pulmonary toilet overall prognosis is guarded. 2. Acute tubular necrosis followed by nephrology for dialysis will continue with supportive care 3. Lobar pneumonia treated with antibiotics we will follow 4. Severe sepsis with shock hemodynamically stable 5. ARDS face x-ray results reviewed more consistent with some CHF would probably benefit from extra fluid removal 6. Acute systolic heart failure as noted above needs extra fluid removal during dialysis if able to tolerate   I have personally seen and evaluated the patient, evaluated laboratory and imaging results,  formulated the assessment and plan and placed orders. The Patient requires high complexity decision making for assessment and support.  Case was discussed on Rounds with the Respiratory Therapy Staff  Yevonne PaxSaadat A Khan, MD [facility] Pulmonary Critical Care Medicine Sleep Medicine

## 2018-03-09 DIAGNOSIS — J9621 Acute and chronic respiratory failure with hypoxia: Secondary | ICD-10-CM | POA: Diagnosis not present

## 2018-03-09 DIAGNOSIS — N170 Acute kidney failure with tubular necrosis: Secondary | ICD-10-CM | POA: Diagnosis not present

## 2018-03-09 DIAGNOSIS — I5021 Acute systolic (congestive) heart failure: Secondary | ICD-10-CM | POA: Diagnosis not present

## 2018-03-09 DIAGNOSIS — J80 Acute respiratory distress syndrome: Secondary | ICD-10-CM | POA: Diagnosis not present

## 2018-03-09 LAB — RENAL FUNCTION PANEL
Albumin: 2.4 g/dL — ABNORMAL LOW (ref 3.5–5.0)
Anion gap: 15 (ref 5–15)
BUN: 47 mg/dL — ABNORMAL HIGH (ref 6–20)
CHLORIDE: 96 mmol/L — AB (ref 101–111)
CO2: 21 mmol/L — ABNORMAL LOW (ref 22–32)
Calcium: 9.8 mg/dL (ref 8.9–10.3)
Creatinine, Ser: 2.41 mg/dL — ABNORMAL HIGH (ref 0.44–1.00)
GFR, EST AFRICAN AMERICAN: 24 mL/min — AB (ref >60)
GFR, EST NON AFRICAN AMERICAN: 20 mL/min — AB (ref >60)
Glucose, Bld: 94 mg/dL (ref 65–99)
POTASSIUM: 5.6 mmol/L — AB (ref 3.5–5.1)
Phosphorus: 6.1 mg/dL — ABNORMAL HIGH (ref 2.5–4.6)
Sodium: 132 mmol/L — ABNORMAL LOW (ref 135–145)

## 2018-03-09 LAB — CBC
HEMATOCRIT: 25.8 % — AB (ref 36.0–46.0)
Hemoglobin: 8.3 g/dL — ABNORMAL LOW (ref 12.0–15.0)
MCH: 30.5 pg (ref 26.0–34.0)
MCHC: 32.2 g/dL (ref 30.0–36.0)
MCV: 94.9 fL (ref 78.0–100.0)
Platelets: 252 10*3/uL (ref 150–400)
RBC: 2.72 MIL/uL — AB (ref 3.87–5.11)
RDW: 23.8 % — AB (ref 11.5–15.5)
WBC: 14.5 10*3/uL — AB (ref 4.0–10.5)

## 2018-03-09 NOTE — Progress Notes (Signed)
[name]  ROUNDING NOTE   Subjective:  Patient just completed hemodialysis. Tolerated well. Tracheostomy remains capped.   Objective:  Vital signs in last 24 hours:  Temperature 96 pulse 93 respirations 16 blood pressure 131/66  Physical Exam: General: Chronically ill appearing  Head: Normocephalic, atraumatic. Moist oral mucosal membranes  Eyes: Anicteric  Neck: Tracheostomy in place, capped  Lungs:  Bilateral rhonchi, normal effort  Heart: S1S2 no rubs  Abdomen:  Soft, nontender, PEG  Extremities: trace peripheral edema.  Neurologic: Awake, alert, follows simple commands  Skin: No lesions  Access: Right internal jugular PermCath, LUE AVF    Basic Metabolic Panel: Recent Labs  Lab 03/03/18 0505 03/04/18 0445 03/07/18 1155 03/09/18 0705  NA 136 132* 131* 132*  K 4.6 5.1 4.7 5.6*  CL 98* 97* 94* 96*  CO2 24 23 25  21*  GLUCOSE 123* 90 111* 94  BUN 41* 57* 63* 47*  CREATININE 1.91* 2.36* 2.86* 2.41*  CALCIUM 9.3 9.2 9.5 9.8  PHOS  --  5.9* 6.2* 6.1*    Liver Function Tests: Recent Labs  Lab 03/03/18 0505 03/04/18 0445 03/07/18 1155 03/09/18 0705  AST 139*  --   --   --   ALT 304*  --   --   --   ALKPHOS 770*  --   --   --   BILITOT 5.7*  --   --   --   PROT 6.1*  --   --   --   ALBUMIN 2.4* 2.4* 2.3* 2.4*   Recent Labs  Lab 03/02/18 1815  LIPASE 29   No results for input(s): AMMONIA in the last 168 hours.  CBC: Recent Labs  Lab 03/05/18 0429 03/05/18 1422 03/07/18 1155 03/08/18 0905 03/09/18 0705  WBC 13.1* 13.3* 13.1* 14.7* 14.5*  HGB 7.4* 7.3* 6.7* 8.2* 8.3*  HCT 23.2* 23.2* 21.0* 25.3* 25.8*  MCV 99.6 97.9 98.6 95.1 94.9  PLT 227 245 252 257 252    Cardiac Enzymes: No results for input(s): CKTOTAL, CKMB, CKMBINDEX, TROPONINI in the last 168 hours.  BNP: Invalid input(s): POCBNP  CBG: No results for input(s): GLUCAP in the last 168 hours.  Microbiology: Results for orders placed or performed during the hospital  encounter of 01/24/18  Culture, Urine     Status: None   Collection Time: 01/28/18  3:00 AM  Result Value Ref Range Status   Specimen Description URINE, RANDOM  Final   Special Requests NONE  Final   Culture   Final    NO GROWTH Performed at [facility], [address] [place], [place] [address]    Report Status 01/29/2018 FINAL  Final    Coagulation Studies: No results for input(s): LABPROT, INR in the last 72 hours.  Urinalysis: No results for input(s): COLORURINE, LABSPEC, PHURINE, GLUCOSEU, HGBUR, BILIRUBINUR, KETONESUR, PROTEINUR, UROBILINOGEN, NITRITE, LEUKOCYTESUR in the last 72 hours.  Invalid input(s): APPERANCEUR    Imaging: Dg Chest Port 1 View  Result Date: 03/08/2018 CLINICAL DATA:  Tracheostomy EXAM: PORTABLE CHEST 1 VIEW COMPARISON:  02/25/2018 FINDINGS: Right dialysis catheter and tracheostomy remain in place, unchanged. Cardiomegaly with vascular congestion. Diffuse interstitial and alveolar airspace opacities compatible with edema/CHF. Probable small layering effusions. IMPRESSION: Mild to moderate CHF.  Small layering effusions.  No real change. Electronically Signed   By: Charlett NoseKevin  Dover M.D.   On: 03/08/2018 09:50     Medications:       Assessment/ Plan:  62 y.o. female with a PMHx of  congestive heart failure, chronic kidney disease stage III, acute respiratory failure status post tracheostomy placement, hypertension, iron deficiency anemia, diabetes mellitus type 2, hyperlipidemia, obesity, who was admitted to [facility] on 01/24/2018 for ongoing treatment of pneumonia, acute respiratory failure, acute renal failure requiring hemodialysis.  1.  Acute renal failure/chronic kidney disease stage III baseline creatinine 1.8.  Patient was seen by nephrology at the outside hospital.  She was initially started on CRRT at outside hospital and subsequently transitioned to intermittent hemodialysis. LUE AVF placed. -Urine output appeared to be less over  the preceding 24 hours.  Still has metabolic derangements that require dialysis.  Serum potassium was 5.6 this a.m.  She completed hemodialysis today.  We will plan for dialysis again on Friday.  2.  Acute respiratory failure.  Patient appears to have a stable tracheostomy.  She is able to verbalize to the Tracheostomy at the moment.  3.  Anemia of chronic kidney disease.  Hemoglobin up to 8.3.  Continue to monitor.  4.  Secondary hyperparathyroidism.   Phosphorus down slightly to 6.1.  5.  Hyperkalemia.  Serum potassium was a bit high at 5.6.  2K potassium bath used.  Repeat serum potassium on Friday.    LOS: 0 Jessel Gettinger 6/12/20191:11 PM

## 2018-03-09 NOTE — Progress Notes (Signed)
[facility] [place]   PULMONARY SERVICE  PROGRESS NOTE  Date of Service: 03/09/2018  Terri Terri Ellis  ZOX:[id number]:[id number]  DOB: May 01, 1956   DOA: 01/24/2018  Referring Physician: Carron CurieAli Hijazi, MD  HPI: Terri Ellis is a 62 y.o. female seen for follow up of Acute on Chronic Respiratory Failure.  She has been refusing BiPAP at nighttime.  She remains with the PMV in place and is also on interslice T collar.  Medications: Reviewed on Rounds  Physical Exam:  Vitals: Temperature 96.5 pulse 93 respiratory rate 16 blood pressure 131/66 saturations 94%  Ventilator Settings off of the ventilator on T collar  . General: Comfortable at this time . Eyes: Grossly normal lids, irises & conjunctiva . ENT: grossly tongue is normal . Neck: no obvious mass . Cardiovascular: S1 S2 normal no gallop . Respiratory: As no rhonchi are noted at this time. . Abdomen: soft . Skin: no rash seen on limited exam . Musculoskeletal: not rigid . Psychiatric:unable to assess . Neurologic: no seizure no involuntary movements         Lab Data:   Basic Metabolic Panel: Recent Labs  Lab 03/03/18 0505 03/04/18 0445 03/07/18 1155 03/09/18 0705  NA 136 132* 131* 132*  K 4.6 5.1 4.7 5.6*  CL 98* 97* 94* 96*  CO2 24 23 25  21*  GLUCOSE 123* 90 111* 94  BUN 41* 57* 63* 47*  CREATININE 1.91* 2.36* 2.86* 2.41*  CALCIUM 9.3 9.2 9.5 9.8  PHOS  --  5.9* 6.2* 6.1*    Liver Function Tests: Recent Labs  Lab 03/03/18 0505 03/04/18 0445 03/07/18 1155 03/09/18 0705  AST 139*  --   --   --   ALT 304*  --   --   --   ALKPHOS 770*  --   --   --   BILITOT 5.7*  --   --   --   PROT 6.1*  --   --   --   ALBUMIN 2.4* 2.4* 2.3* 2.4*   Recent Labs  Lab 03/02/18 1815  LIPASE 29   No results for input(s): AMMONIA in the last 168 hours.  CBC: Recent Labs  Lab 03/05/18 0429 03/05/18 1422 03/07/18 1155 03/08/18 0905 03/09/18 0705  WBC 13.1*  13.3* 13.1* 14.7* 14.5*  HGB 7.4* 7.3* 6.7* 8.2* 8.3*  HCT 23.2* 23.2* 21.0* 25.3* 25.8*  MCV 99.6 97.9 98.6 95.1 94.9  PLT 227 245 252 257 252    Cardiac Enzymes: No results for input(s): CKTOTAL, CKMB, CKMBINDEX, TROPONINI in the last 168 hours.  BNP (last 3 results) No results for input(s): BNP in the last 8760 hours.  ProBNP (last 3 results) No results for input(s): PROBNP in the last 8760 hours.  Radiological Exams: Dg Chest Port 1 View  Result Date: 03/08/2018 CLINICAL DATA:  Tracheostomy EXAM: PORTABLE CHEST 1 VIEW COMPARISON:  02/25/2018 FINDINGS: Right dialysis catheter and tracheostomy remain in place, unchanged. Cardiomegaly with vascular congestion. Diffuse interstitial and alveolar airspace opacities compatible with edema/CHF. Probable small layering effusions. IMPRESSION: Mild to moderate CHF.  Small layering effusions.  No real change. Electronically Signed   By: Charlett NoseKevin  Dover M.D.   On: 03/08/2018 09:50    Assessment/Plan Principal Problem:   Acute on chronic respiratory failure with hypoxia (HCC) Active Problems:   Acute tubular necrosis (HCC)   Lobar pneumonia (HCC)   Severe sepsis with septic shock (HCC)   ARDS (adult respiratory distress syndrome) (HCC)   Acute systolic  CHF (congestive heart failure) (HCC)   1. Acute on chronic respiratory failure with hypoxia we will continue with T collar she is not being compliant with suggested use of the BiPAP at nighttime.  Options here are to continue with T collar forward to decannulation.  My fear is that long-term she will not do well because of her failure to use the BiPAP.  She is however able to make her own decisions and refused therapy if she so desires she has been told of the potential serious ramifications including and up up to death from noncompliance with recommended therapy. 2. Acute renal failure followed by nephrology for her dialysis this will be continued. 3. Severe sepsis with shock resolved 4. ARDS  clinically resolved on the chest x-ray. 5. Acute systolic heart failure right now is compensated   I have personally seen and evaluated the patient, evaluated laboratory and imaging results, formulated the assessment and plan and placed orders. The Patient requires high complexity decision making for assessment and support.  Case was discussed on Rounds with the Respiratory Therapy Staff  Yevonne Pax, MD [facility] Pulmonary Critical Care Medicine Sleep Medicine

## 2018-03-10 ENCOUNTER — Other Ambulatory Visit (HOSPITAL_COMMUNITY): Payer: Self-pay

## 2018-03-10 DIAGNOSIS — J9621 Acute and chronic respiratory failure with hypoxia: Secondary | ICD-10-CM | POA: Diagnosis not present

## 2018-03-10 DIAGNOSIS — J80 Acute respiratory distress syndrome: Secondary | ICD-10-CM | POA: Diagnosis not present

## 2018-03-10 DIAGNOSIS — I5021 Acute systolic (congestive) heart failure: Secondary | ICD-10-CM | POA: Diagnosis not present

## 2018-03-10 DIAGNOSIS — N170 Acute kidney failure with tubular necrosis: Secondary | ICD-10-CM | POA: Diagnosis not present

## 2018-03-10 LAB — CBC
HEMATOCRIT: 24.4 % — AB (ref 36.0–46.0)
HEMOGLOBIN: 7.9 g/dL — AB (ref 12.0–15.0)
MCH: 31.2 pg (ref 26.0–34.0)
MCHC: 32.4 g/dL (ref 30.0–36.0)
MCV: 96.4 fL (ref 78.0–100.0)
Platelets: 252 10*3/uL (ref 150–400)
RBC: 2.53 MIL/uL — AB (ref 3.87–5.11)
RDW: 24.2 % — ABNORMAL HIGH (ref 11.5–15.5)
WBC: 13.2 10*3/uL — AB (ref 4.0–10.5)

## 2018-03-10 NOTE — Progress Notes (Signed)
[facility]ulmonary Critical Care [facility] [place]   PULMONARY SERVICE  PROGRESS NOTE  Date of Service: 03/10/2018  Terri LigasLillian Patterson Deatley  YNW:[id number]:[id number]  DOB: 1956-06-08   DOA: 01/24/2018  Referring Physician: Carron CurieAli Hijazi, MD  HPI: Terri Ellis is a 62 y.o. female seen for follow up of Acute on Chronic Respiratory Failure.  Comfortable without distress at this time.  Patient's been on T collar has also been tolerating the PMV fairly well.  Not yet capping however  Medications: Reviewed on Rounds  Physical Exam:  Vitals: Temperature 97.0 pulse 97 respiratory rate 12 blood pressure 114/68 saturations 95%  Ventilator Settings T collar trials patient on PMV  . General: Comfortable at this time . Eyes: Grossly normal lids, irises & conjunctiva . ENT: grossly tongue is normal . Neck: no obvious mass . Cardiovascular: S1 S2 normal no gallop . Respiratory: No rhonchi expansion is equal . Abdomen: soft . Skin: no rash seen on limited exam . Musculoskeletal: not rigid . Psychiatric:unable to assess . Neurologic: no seizure no involuntary movements         Lab Data:   Basic Metabolic Panel: Recent Labs  Lab 03/04/18 0445 03/07/18 1155 03/09/18 0705  NA 132* 131* 132*  K 5.1 4.7 5.6*  CL 97* 94* 96*  CO2 23 25 21*  GLUCOSE 90 111* 94  BUN 57* 63* 47*  CREATININE 2.36* 2.86* 2.41*  CALCIUM 9.2 9.5 9.8  PHOS 5.9* 6.2* 6.1*    Liver Function Tests: Recent Labs  Lab 03/04/18 0445 03/07/18 1155 03/09/18 0705  ALBUMIN 2.4* 2.3* 2.4*   No results for input(s): LIPASE, AMYLASE in the last 168 hours. No results for input(s): AMMONIA in the last 168 hours.  CBC: Recent Labs  Lab 03/05/18 1422 03/07/18 1155 03/08/18 0905 03/09/18 0705 03/10/18 0657  WBC 13.3* 13.1* 14.7* 14.5* 13.2*  HGB 7.3* 6.7* 8.2* 8.3* 7.9*  HCT 23.2* 21.0* 25.3* 25.8* 24.4*  MCV 97.9 98.6 95.1 94.9 96.4  PLT 245 252 257 252 252    Cardiac Enzymes: No results  for input(s): CKTOTAL, CKMB, CKMBINDEX, TROPONINI in the last 168 hours.  BNP (last 3 results) No results for input(s): BNP in the last 8760 hours.  ProBNP (last 3 results) No results for input(s): PROBNP in the last 8760 hours.  Radiological Exams: No results found.  Assessment/Plan Principal Problem:   Acute on chronic respiratory failure with hypoxia (HCC) Active Problems:   Acute tubular necrosis (HCC)   Lobar pneumonia (HCC)   Severe sepsis with septic shock (HCC)   ARDS (adult respiratory distress syndrome) (HCC)   Acute systolic CHF (congestive heart failure) (HCC)   1. Acute on chronic respiratory failure with hypoxia we will continue with T collar continue pulmonary toilet supportive care overall prognosis guarded. 2. Acute renal failure followed by nephrology for dialysis 3. Severe sepsis hemodynamically stable 4. ARDS clinically resolved 5. Systolic heart failure we will continue to monitor fluid status   I have personally seen and evaluated the patient, evaluated laboratory and imaging results, formulated the assessment and plan and placed orders. The Patient requires high complexity decision making for assessment and support.  Case was discussed on Rounds with the Respiratory Therapy Staff  Yevonne PaxSaadat A Khan, MD [facility] Pulmonary Critical Care Medicine Sleep Medicine

## 2018-03-11 ENCOUNTER — Other Ambulatory Visit (HOSPITAL_COMMUNITY): Payer: Self-pay

## 2018-03-11 DIAGNOSIS — J80 Acute respiratory distress syndrome: Secondary | ICD-10-CM | POA: Diagnosis not present

## 2018-03-11 DIAGNOSIS — A419 Sepsis, unspecified organism: Secondary | ICD-10-CM | POA: Diagnosis not present

## 2018-03-11 DIAGNOSIS — J9621 Acute and chronic respiratory failure with hypoxia: Secondary | ICD-10-CM | POA: Diagnosis not present

## 2018-03-11 DIAGNOSIS — N170 Acute kidney failure with tubular necrosis: Secondary | ICD-10-CM | POA: Diagnosis not present

## 2018-03-11 DIAGNOSIS — J181 Lobar pneumonia, unspecified organism: Secondary | ICD-10-CM | POA: Diagnosis not present

## 2018-03-11 DIAGNOSIS — R6521 Severe sepsis with septic shock: Secondary | ICD-10-CM | POA: Diagnosis not present

## 2018-03-11 DIAGNOSIS — I5021 Acute systolic (congestive) heart failure: Secondary | ICD-10-CM | POA: Diagnosis not present

## 2018-03-11 LAB — CBC
HCT: 23.4 % — ABNORMAL LOW (ref 36.0–46.0)
HEMOGLOBIN: 7.5 g/dL — AB (ref 12.0–15.0)
MCH: 31 pg (ref 26.0–34.0)
MCHC: 32.1 g/dL (ref 30.0–36.0)
MCV: 96.7 fL (ref 78.0–100.0)
Platelets: 288 10*3/uL (ref 150–400)
RBC: 2.42 MIL/uL — AB (ref 3.87–5.11)
RDW: 24.4 % — ABNORMAL HIGH (ref 11.5–15.5)
WBC: 15.2 10*3/uL — ABNORMAL HIGH (ref 4.0–10.5)

## 2018-03-11 LAB — RENAL FUNCTION PANEL
ALBUMIN: 2.4 g/dL — AB (ref 3.5–5.0)
ANION GAP: 12 (ref 5–15)
BUN: 46 mg/dL — ABNORMAL HIGH (ref 6–20)
CO2: 24 mmol/L (ref 22–32)
Calcium: 9.4 mg/dL (ref 8.9–10.3)
Chloride: 92 mmol/L — ABNORMAL LOW (ref 101–111)
Creatinine, Ser: 2.51 mg/dL — ABNORMAL HIGH (ref 0.44–1.00)
GFR calc Af Amer: 23 mL/min — ABNORMAL LOW (ref >60)
GFR calc non Af Amer: 19 mL/min — ABNORMAL LOW (ref >60)
GLUCOSE: 106 mg/dL — AB (ref 65–99)
PHOSPHORUS: 6.1 mg/dL — AB (ref 2.5–4.6)
Potassium: 4.8 mmol/L (ref 3.5–5.1)
SODIUM: 128 mmol/L — AB (ref 135–145)

## 2018-03-11 NOTE — Progress Notes (Signed)
[facility] Critical [facility] [place]   PULMONARY SERVICE  PROGRESS NOTE  Date of Service: 03/11/2018  Dereck LigasLillian Patterson Ungaro  WNU:[id number]  DOB: 08-18-1956   DOA: 01/24/2018  Referring Physician: Carron CurieAli Hijazi, MD  HPI: Dereck LigasLillian Patterson Revels is a 62 y.o. female seen for follow up of Acute on Chronic Respiratory Failure.  She is on T collar has been tolerating PMV not yet attempted capping  Medications: Reviewed on Rounds  Physical Exam:  Vitals: Temperature 97.7 pulse 90 respiratory rate 16 blood pressure 140/80 saturations 98%  Ventilator Settings currently off the ventilator on 35%  . General: Comfortable at this time . Eyes: Grossly normal lids, irises & conjunctiva . ENT: grossly tongue is normal . Neck: no obvious mass . Cardiovascular: S1 S2 normal no gallop . Respiratory: Good air entry no rhonchi . Abdomen: soft . Skin: no rash seen on limited exam . Musculoskeletal: not rigid . Psychiatric:unable to assess . Neurologic: no seizure no involuntary movements         Lab Data:   Basic Metabolic Panel: Recent Labs  Lab 03/07/18 1155 03/09/18 0705 03/11/18 0632  NA 131* 132* 128*  K 4.7 5.6* 4.8  CL 94* 96* 92*  CO2 25 21* 24  GLUCOSE 111* 94 106*  BUN 63* 47* 46*  CREATININE 2.86* 2.41* 2.51*  CALCIUM 9.5 9.8 9.4  PHOS 6.2* 6.1* 6.1*    Liver Function Tests: Recent Labs  Lab 03/07/18 1155 03/09/18 0705 03/11/18 0632  ALBUMIN 2.3* 2.4* 2.4*   No results for input(s): LIPASE, AMYLASE in the last 168 hours. No results for input(s): AMMONIA in the last 168 hours.  CBC: Recent Labs  Lab 03/07/18 1155 03/08/18 0905 03/09/18 0705 03/10/18 0657 03/11/18 0632  WBC 13.1* 14.7* 14.5* 13.2* 15.2*  HGB 6.7* 8.2* 8.3* 7.9* 7.5*  HCT 21.0* 25.3* 25.8* 24.4* 23.4*  MCV 98.6 95.1 94.9 96.4 96.7  PLT 252 257 252 252 288    Cardiac Enzymes: No results for input(s): CKTOTAL, CKMB, CKMBINDEX, TROPONINI in the last 168  hours.  BNP (last 3 results) No results for input(s): BNP in the last 8760 hours.  ProBNP (last 3 results) No results for input(s): PROBNP in the last 8760 hours.  Radiological Exams: Dg Chest Port 1 View  Result Date: 03/11/2018 CLINICAL DATA:  Tracheostomy tube. EXAM: PORTABLE CHEST 1 VIEW COMPARISON:  03/08/2018. FINDINGS: Tracheostomy tube noted in stable position. Right IJ dialysis catheter in stable position. Persistent cardiomegaly and bilateral interstitial prominence consistent with CHF again noted. Small bilateral pleural effusions again noted. No interim change. IMPRESSION: 1.  Lines and tubes in stable position. 2. Persistent cardiomegaly and bilateral interstitial prominence. Persistent bilateral pleural effusions. These findings are consistent with CHF. No interim change from prior exam. Electronically Signed   By: Maisie Fushomas  Register   On: 03/11/2018 07:02    Assessment/Plan Principal Problem:   Acute on chronic respiratory failure with hypoxia (HCC) Active Problems:   Acute tubular necrosis (HCC)   Lobar pneumonia (HCC)   Severe sepsis with septic shock (HCC)   ARDS (adult respiratory distress syndrome) (HCC)   Acute systolic CHF (congestive heart failure) (HCC)   1. Acute on chronic respiratory failure with hypoxia continue with PMV we will advance him to capping we will continue secretion management pulmonary toilet 2. Acute tubular necrosis renal failure followed by nephrology she is been doing fine with dialysis. 3. Lobar pneumonia treated with antibiotics 4. Severe sepsis resolved 5. ARDS resolved 6. Acute systolic  heart failure moderate fluid status closely   I have personally seen and evaluated the patient, evaluated laboratory and imaging results, formulated the assessment and plan and placed orders. The Patient requires high complexity decision making for assessment and support.  Case was discussed on Rounds with the Respiratory Therapy Staff  Yevonne Pax,  MD [facility] Pulmonary Critical Care Medicine Sleep Medicine

## 2018-03-11 NOTE — Progress Notes (Signed)
[facility]  ROUNDING NOTE   Subjective:  Patient due for hemodialysis later today. BUN currently 46 with a creatinine of 2.5. Still remains dialysis dependent however.   Objective:  Vital signs in last 24 hours:  Temperature 97.7 pulse 90 respirations 16 blood pressure 140/80  Physical Exam: General: Chronically ill appearing  Head: Normocephalic, atraumatic. Moist oral mucosal membranes  Eyes: Anicteric  Neck: Tracheostomy in place, capped  Lungs:  Bilateral rhonchi, normal effort  Heart: S1S2 no rubs  Abdomen:  Soft, nontender, PEG  Extremities: trace peripheral edema.  Neurologic: Awake, alert, follows simple commands  Skin: No lesions  Access: Right internal jugular PermCath, LUE AVF    Basic Metabolic Panel: Recent Labs  Lab 03/07/18 1155 03/09/18 0705 03/11/18 0632  NA 131* 132* 128*  K 4.7 5.6* 4.8  CL 94* 96* 92*  CO2 25 21* 24  GLUCOSE 111* 94 106*  BUN 63* 47* 46*  CREATININE 2.86* 2.41* 2.51*  CALCIUM 9.5 9.8 9.4  PHOS 6.2* 6.1* 6.1*    Liver Function Tests: Recent Labs  Lab 03/07/18 1155 03/09/18 0705 03/11/18 0632  ALBUMIN 2.3* 2.4* 2.4*   No results for input(s): LIPASE, AMYLASE in the last 168 hours. No results for input(s): AMMONIA in the last 168 hours.  CBC: Recent Labs  Lab 03/07/18 1155 03/08/18 0905 03/09/18 0705 03/10/18 0657 03/11/18 0632  WBC 13.1* 14.7* 14.5* 13.2* 15.2*  HGB 6.7* 8.2* 8.3* 7.9* 7.5*  HCT 21.0* 25.3* 25.8* 24.4* 23.4*  MCV 98.6 95.1 94.9 96.4 96.7  PLT 252 257 252 252 288    Cardiac Enzymes: No results for input(s): CKTOTAL, CKMB, CKMBINDEX, TROPONINI in the last 168 hours.  BNP: Invalid input(s): POCBNP  CBG: No results for input(s): GLUCAP in the last 168 hours.  Microbiology: Results for orders placed or performed during the hospital encounter of 01/24/18  Culture, Urine     Status: None   Collection Time: 01/28/18  3:00 AM  Result Value Ref Range Status   Specimen  Description URINE, RANDOM  Final   Special Requests NONE  Final   Culture   Final    NO GROWTH Performed at [facility] Lab, [address] [place], [place] [address]    Report Status 01/29/2018 FINAL  Final    Coagulation Studies: No results for input(s): LABPROT, INR in the last 72 hours.  Urinalysis: No results for input(s): COLORURINE, LABSPEC, PHURINE, GLUCOSEU, HGBUR, BILIRUBINUR, KETONESUR, PROTEINUR, UROBILINOGEN, NITRITE, LEUKOCYTESUR in the last 72 hours.  Invalid input(s): APPERANCEUR    Imaging: Dg Chest Port 1 View  Result Date: 03/11/2018 CLINICAL DATA:  Tracheostomy tube. EXAM: PORTABLE CHEST 1 VIEW COMPARISON:  03/08/2018. FINDINGS: Tracheostomy tube noted in stable position. Right IJ dialysis catheter in stable position. Persistent cardiomegaly and bilateral interstitial prominence consistent with CHF again noted. Small bilateral pleural effusions again noted. No interim change. IMPRESSION: 1.  Lines and tubes in stable position. 2. Persistent cardiomegaly and bilateral interstitial prominence. Persistent bilateral pleural effusions. These findings are consistent with CHF. No interim change from prior exam. Electronically Signed   By: Maisie Fus  Register   On: 03/11/2018 07:02     Medications:       Assessment/ Plan:  62 y.o. female with a PMHx of congestive heart failure, chronic kidney disease stage III, acute respiratory failure status post tracheostomy placement, hypertension, iron deficiency anemia, diabetes mellitus type 2, hyperlipidemia, obesity, who was admitted to [facility] on 01/24/2018 for ongoing treatment of pneumonia, acute respiratory  failure, acute renal failure requiring hemodialysis.  1.  Acute renal failure/chronic kidney disease stage III baseline creatinine 1.8.  Patient was seen by nephrology at the outside hospital.  She was initially started on CRRT at outside hospital and subsequently transitioned to intermittent hemodialysis. LUE  AVF placed. -Patient due for hemodialysis today.  Orders have been prepared.  Thereafter next dialysis treatment on Monday.  2.  Acute respiratory failure.  Patient breathing comfortably through her Tracheostomy.  3.  Anemia of chronic kidney disease.  Hemoglobin continues to fluctuate.  Hemoglobin currently down to 7.5.  Continue to monitor.  4.  Secondary hyperparathyroidism.   Phosphorus remains stable at 6.1.  We will add Renvela powder 2.4 g p.o. 3 times daily.  5.  Hyperkalemia.  Serum potassium corrected and currently down to 4.8.  Repeat serum potassium on Monday.   LOS: 0 Durrell Barajas 6/14/20198:47 AM

## 2018-03-12 DIAGNOSIS — J9621 Acute and chronic respiratory failure with hypoxia: Secondary | ICD-10-CM | POA: Diagnosis not present

## 2018-03-12 DIAGNOSIS — J80 Acute respiratory distress syndrome: Secondary | ICD-10-CM | POA: Diagnosis not present

## 2018-03-12 DIAGNOSIS — N170 Acute kidney failure with tubular necrosis: Secondary | ICD-10-CM | POA: Diagnosis not present

## 2018-03-12 DIAGNOSIS — I5021 Acute systolic (congestive) heart failure: Secondary | ICD-10-CM | POA: Diagnosis not present

## 2018-03-12 NOTE — Progress Notes (Signed)
Pulmonary Critical Care Medicine [facility] [place]   PULMONARY SERVICE  PROGRESS NOTE  Date of Service: 03/12/2018  Terri Ellis  ZOX:[id number]  DOB: 11-Jun-1956   DOA: 01/24/2018  Referring Physician: Carron Curie, MD  HPI: Terri Ellis is a 62 y.o. female seen for follow up of Acute on Chronic Respiratory Failure.  Patient is on T collar with PMV has been tolerating the PMV fairly well.  No fevers are noted at this morning  Medications: Reviewed on Rounds  Physical Exam:  Vitals: Temperature 97.7 pulse 95 respiratory rate 24 blood pressure 101/79 saturations are 96%  Ventilator Settings off of the ventilator on T collar with 28% FiO2  . General: Comfortable at this time . Eyes: Grossly normal lids, irises & conjunctiva . ENT: grossly tongue is normal . Neck: no obvious mass . Cardiovascular: S1 S2 normal no gallop . Respiratory: Coarse breath sounds scattered rhonchi . Abdomen: soft . Skin: no rash seen on limited exam . Musculoskeletal: not rigid . Psychiatric:unable to assess . Neurologic: no seizure no involuntary movements         Lab Data:   Basic Metabolic Panel: Recent Labs  Lab 03/07/18 1155 03/09/18 0705 03/11/18 0632  NA 131* 132* 128*  K 4.7 5.6* 4.8  CL 94* 96* 92*  CO2 25 21* 24  GLUCOSE 111* 94 106*  BUN 63* 47* 46*  CREATININE 2.86* 2.41* 2.51*  CALCIUM 9.5 9.8 9.4  PHOS 6.2* 6.1* 6.1*    Liver Function Tests: Recent Labs  Lab 03/07/18 1155 03/09/18 0705 03/11/18 0632  ALBUMIN 2.3* 2.4* 2.4*   No results for input(s): LIPASE, AMYLASE in the last 168 hours. No results for input(s): AMMONIA in the last 168 hours.  CBC: Recent Labs  Lab 03/07/18 1155 03/08/18 0905 03/09/18 0705 03/10/18 0657 03/11/18 0632  WBC 13.1* 14.7* 14.5* 13.2* 15.2*  HGB 6.7* 8.2* 8.3* 7.9* 7.5*  HCT 21.0* 25.3* 25.8* 24.4* 23.4*  MCV 98.6 95.1 94.9 96.4 96.7  PLT 252 257 252 252 288    Cardiac Enzymes: No results  for input(s): CKTOTAL, CKMB, CKMBINDEX, TROPONINI in the last 168 hours.  BNP (last 3 results) No results for input(s): BNP in the last 8760 hours.  ProBNP (last 3 results) No results for input(s): PROBNP in the last 8760 hours.  Radiological Exams: Dg Chest Port 1 View  Result Date: 03/11/2018 CLINICAL DATA:  Tracheostomy tube. EXAM: PORTABLE CHEST 1 VIEW COMPARISON:  03/08/2018. FINDINGS: Tracheostomy tube noted in stable position. Right IJ dialysis catheter in stable position. Persistent cardiomegaly and bilateral interstitial prominence consistent with CHF again noted. Small bilateral pleural effusions again noted. No interim change. IMPRESSION: 1.  Lines and tubes in stable position. 2. Persistent cardiomegaly and bilateral interstitial prominence. Persistent bilateral pleural effusions. These findings are consistent with CHF. No interim change from prior exam. Electronically Signed   By: Maisie Fus  Register   On: 03/11/2018 07:02    Assessment/Plan Principal Problem:   Acute on chronic respiratory failure with hypoxia (HCC) Active Problems:   Acute tubular necrosis (HCC)   Lobar pneumonia (HCC)   Severe sepsis with septic shock (HCC)   ARDS (adult respiratory distress syndrome) (HCC)   Acute systolic CHF (congestive heart failure) (HCC)   1. Acute on chronic respiratory failure with hypoxia we will continue with T collar continue pulmonary toilet supportive care 2. Acute renal failure stable we will continue to follow nephrology recommendations for dialysis. 3. Lobar pneumonia treated with antibiotics we will continue  present management. 4. Severe sepsis with shock clinically improved we will follow 5. ARDS resolved 6. Acute systolic heart failure compensated with some small effusion still noted   I have personally seen and evaluated the patient, evaluated laboratory and imaging results, formulated the assessment and plan and placed orders. The Patient requires high complexity  decision making for assessment and support.  Case was discussed on Rounds with the Respiratory Therapy Staff  Yevonne PaxSaadat A Khan, MD [facility] Pulmonary Critical Care Medicine Sleep Medicine

## 2018-03-14 DIAGNOSIS — R6521 Severe sepsis with septic shock: Secondary | ICD-10-CM | POA: Diagnosis not present

## 2018-03-14 DIAGNOSIS — J80 Acute respiratory distress syndrome: Secondary | ICD-10-CM | POA: Diagnosis not present

## 2018-03-14 DIAGNOSIS — J9621 Acute and chronic respiratory failure with hypoxia: Secondary | ICD-10-CM | POA: Diagnosis not present

## 2018-03-14 DIAGNOSIS — J181 Lobar pneumonia, unspecified organism: Secondary | ICD-10-CM | POA: Diagnosis not present

## 2018-03-14 DIAGNOSIS — A419 Sepsis, unspecified organism: Secondary | ICD-10-CM | POA: Diagnosis not present

## 2018-03-14 DIAGNOSIS — I5021 Acute systolic (congestive) heart failure: Secondary | ICD-10-CM | POA: Diagnosis not present

## 2018-03-14 DIAGNOSIS — N170 Acute kidney failure with tubular necrosis: Secondary | ICD-10-CM | POA: Diagnosis not present

## 2018-03-14 LAB — RENAL FUNCTION PANEL
ALBUMIN: 2.5 g/dL — AB (ref 3.5–5.0)
ANION GAP: 16 — AB (ref 5–15)
BUN: 53 mg/dL — ABNORMAL HIGH (ref 6–20)
CALCIUM: 9.5 mg/dL (ref 8.9–10.3)
CO2: 19 mmol/L — ABNORMAL LOW (ref 22–32)
Chloride: 91 mmol/L — ABNORMAL LOW (ref 101–111)
Creatinine, Ser: 3.1 mg/dL — ABNORMAL HIGH (ref 0.44–1.00)
GFR calc non Af Amer: 15 mL/min — ABNORMAL LOW (ref >60)
GFR, EST AFRICAN AMERICAN: 17 mL/min — AB (ref >60)
Glucose, Bld: 100 mg/dL — ABNORMAL HIGH (ref 65–99)
PHOSPHORUS: 7.3 mg/dL — AB (ref 2.5–4.6)
Potassium: 6 mmol/L — ABNORMAL HIGH (ref 3.5–5.1)
SODIUM: 126 mmol/L — AB (ref 135–145)

## 2018-03-14 LAB — CBC
HCT: 21.4 % — ABNORMAL LOW (ref 36.0–46.0)
HEMOGLOBIN: 6.7 g/dL — AB (ref 12.0–15.0)
MCH: 31.2 pg (ref 26.0–34.0)
MCHC: 31.3 g/dL (ref 30.0–36.0)
MCV: 99.5 fL (ref 78.0–100.0)
Platelets: 315 10*3/uL (ref 150–400)
RBC: 2.15 MIL/uL — AB (ref 3.87–5.11)
RDW: 24.6 % — ABNORMAL HIGH (ref 11.5–15.5)
WBC: 14.3 10*3/uL — ABNORMAL HIGH (ref 4.0–10.5)

## 2018-03-14 LAB — PREPARE RBC (CROSSMATCH)

## 2018-03-14 NOTE — Progress Notes (Signed)
[facility] [place]   PULMONARY SERVICE  PROGRESS NOTE  Date of Service: 03/14/2018  Terri LigasLillian Patterson Rae  ZOX:[id number]  DOB: 1956/03/17   DOA: 01/24/2018  Referring Physician: Carron CurieAli Hijazi, MD  HPI: Terri Ellis is a 62 y.o. female seen for follow up of Acute on Chronic Respiratory Failure.  Patient is comfortable without distress has been capping doing well  Medications: Reviewed on Rounds  Physical Exam:  Vitals: Temperature 97.5 pulse 97 respiratory 19 blood pressure 120/60 saturations 93%  Ventilator Settings capping trials on 2 L nasal cannula  . General: Comfortable at this time . Eyes: Grossly normal lids, irises & conjunctiva . ENT: grossly tongue is normal . Neck: no obvious mass . Cardiovascular: S1 S2 normal no gallop . Respiratory: No rhonchi or rales are noted . Abdomen: soft . Skin: no rash seen on limited exam . Musculoskeletal: not rigid . Psychiatric:unable to assess . Neurologic: no seizure no involuntary movements         Lab Data:   Basic Metabolic Panel: Recent Labs  Lab 03/07/18 1155 03/09/18 0705 03/11/18 0632 03/14/18 0558  NA 131* 132* 128* 126*  K 4.7 5.6* 4.8 6.0*  CL 94* 96* 92* 91*  CO2 25 21* 24 19*  GLUCOSE 111* 94 106* 100*  BUN 63* 47* 46* 53*  CREATININE 2.86* 2.41* 2.51* 3.10*  CALCIUM 9.5 9.8 9.4 9.5  PHOS 6.2* 6.1* 6.1* 7.3*    Liver Function Tests: Recent Labs  Lab 03/07/18 1155 03/09/18 0705 03/11/18 0632 03/14/18 0558  ALBUMIN 2.3* 2.4* 2.4* 2.5*   No results for input(s): LIPASE, AMYLASE in the last 168 hours. No results for input(s): AMMONIA in the last 168 hours.  CBC: Recent Labs  Lab 03/07/18 1155 03/08/18 0905 03/09/18 0705 03/10/18 0657 03/11/18 0632  WBC 13.1* 14.7* 14.5* 13.2* 15.2*  HGB 6.7* 8.2* 8.3* 7.9* 7.5*  HCT 21.0* 25.3* 25.8* 24.4* 23.4*  MCV 98.6 95.1 94.9 96.4 96.7  PLT 252 257 252 252 288    Cardiac Enzymes: No  results for input(s): CKTOTAL, CKMB, CKMBINDEX, TROPONINI in the last 168 hours.  BNP (last 3 results) No results for input(s): BNP in the last 8760 hours.  ProBNP (last 3 results) No results for input(s): PROBNP in the last 8760 hours.  Radiological Exams: No results found.  Assessment/Plan Principal Problem:   Acute on chronic respiratory failure with hypoxia (HCC) Active Problems:   Acute tubular necrosis (HCC)   Lobar pneumonia (HCC)   Severe sepsis with septic shock (HCC)   ARDS (adult respiratory distress syndrome) (HCC)   Acute systolic CHF (congestive heart failure) (HCC)   1. Acute on chronic respiratory failure with hypoxia we will continue with capping hopefully patient will be discharged today continue pulmonary toilet supportive care. 2. Acute renal failure followed by nephrology for dialysis will continue present management. 3. ARDS resolved 4. Sepsis is resolved hemodynamically stable 5. Acute systolic heart failure monitor fluid status   I have personally seen and evaluated the patient, evaluated laboratory and imaging results, formulated the assessment and plan and placed orders. The Patient requires high complexity decision making for assessment and support.  Case was discussed on Rounds with the Respiratory Therapy Staff  Yevonne PaxSaadat A Orest Dygert, MD [facility] Pulmonary Critical Care Medicine Sleep Medicine

## 2018-03-14 NOTE — Progress Notes (Signed)
[facility]  ROUNDING NOTE   Subjective:  Patient seen and evaluated during hemodialysis today. Potassium high at 6.0.  Hemoglobin also low 6.7. She is to be transfused with PRBCs today.   Objective:  Vital signs in last 24 hours:  Temperature 97.7 pulse 90 respirations 16 blood pressure 140/80  Physical Exam: General: Chronically ill appearing  Head: Normocephalic, atraumatic. Moist oral mucosal membranes  Eyes: Anicteric  Neck: Tracheostomy in place, capped  Lungs:  Bilateral rhonchi, normal effort  Heart: S1S2 no rubs  Abdomen:  Soft, nontender, PEG  Extremities: 1+ peripheral edema.  Neurologic: Awake, alert, follows simple commands  Skin: No lesions  Access: Right internal jugular PermCath, LUE AVF    Basic Metabolic Panel: Recent Labs  Lab 03/07/18 1155 03/09/18 0705 03/11/18 0632 03/14/18 0558  NA 131* 132* 128* 126*  K 4.7 5.6* 4.8 6.0*  CL 94* 96* 92* 91*  CO2 25 21* 24 19*  GLUCOSE 111* 94 106* 100*  BUN 63* 47* 46* 53*  CREATININE 2.86* 2.41* 2.51* 3.10*  CALCIUM 9.5 9.8 9.4 9.5  PHOS 6.2* 6.1* 6.1* 7.3*    Liver Function Tests: Recent Labs  Lab 03/07/18 1155 03/09/18 0705 03/11/18 0632 03/14/18 0558  ALBUMIN 2.3* 2.4* 2.4* 2.5*   No results for input(s): LIPASE, AMYLASE in the last 168 hours. No results for input(s): AMMONIA in the last 168 hours.  CBC: Recent Labs  Lab 03/08/18 0905 03/09/18 0705 03/10/18 0657 03/11/18 0632 03/14/18 0735  WBC 14.7* 14.5* 13.2* 15.2* 14.3*  HGB 8.2* 8.3* 7.9* 7.5* 6.7*  HCT 25.3* 25.8* 24.4* 23.4* 21.4*  MCV 95.1 94.9 96.4 96.7 99.5  PLT 257 252 252 288 315    Cardiac Enzymes: No results for input(s): CKTOTAL, CKMB, CKMBINDEX, TROPONINI in the last 168 hours.  BNP: Invalid input(s): POCBNP  CBG: No results for input(s): GLUCAP in the last 168 hours.  Microbiology: Results for orders placed or performed during the hospital encounter of 01/24/18  Culture, Urine     Status: None    Collection Time: 01/28/18  3:00 AM  Result Value Ref Range Status   Specimen Description URINE, RANDOM  Final   Special Requests NONE  Final   Culture   Final    NO GROWTH Performed at [facility], [address] [place], [place] [address]    Report Status 01/29/2018 FINAL  Final    Coagulation Studies: No results for input(s): LABPROT, INR in the last 72 hours.  Urinalysis: No results for input(s): COLORURINE, LABSPEC, PHURINE, GLUCOSEU, HGBUR, BILIRUBINUR, KETONESUR, PROTEINUR, UROBILINOGEN, NITRITE, LEUKOCYTESUR in the last 72 hours.  Invalid input(s): APPERANCEUR    Imaging: No results found.   Medications:       Assessment/ Plan:  62 y.o. female with a PMHx of congestive heart failure, chronic kidney disease stage III, acute respiratory failure status post tracheostomy placement, hypertension, iron deficiency anemia, diabetes mellitus type 2, hyperlipidemia, obesity, who was admitted to [facility] on 01/24/2018 for ongoing treatment of pneumonia, acute respiratory failure, acute renal failure requiring hemodialysis.  1.  Acute renal failure/chronic kidney disease stage III baseline creatinine 1.8.  Patient was seen by nephrology at the outside hospital.  She was initially started on CRRT at outside hospital and subsequently transitioned to intermittent hemodialysis. LUE AVF placed. -Patient most likely has ESRD.  Creatinine did trend up over the weekend to 3.10.  Suspect that her debilitated state has led to low muscle mass.  She remains dialysis dependent at  this time.  Patient seen and evaluated during hemodialysis today.  She will be receiving PRBC transfusion.  2.  Acute respiratory failure.  Tracheostomy capped and functional.  3.  Anemia of chronic kidney disease.  Hemoglobin down to 6.7.  She has been evaluated by gastroenterology in the past.  Patient to be transfused PRBC today.  4.  Secondary hyperparathyroidism.   Phosphorus remains high at 7.3  despite Renvela.  5.  Hyperkalemia.  Potassium elevated at 6.0.  Patient replaced on 1K bath during dialysis treatment today.   LOS: 0 Terri Ellis 6/17/201910:43 AM

## 2018-03-15 LAB — TYPE AND SCREEN
ABO/RH(D): O POS
Antibody Screen: NEGATIVE
UNIT DIVISION: 0
Unit division: 0

## 2018-03-15 LAB — BPAM RBC
BLOOD PRODUCT EXPIRATION DATE: 201907142359
Blood Product Expiration Date: 201907142359
ISSUE DATE / TIME: 201906171253
ISSUE DATE / TIME: 201906171551
UNIT TYPE AND RH: 5100
Unit Type and Rh: 5100

## 2018-03-15 LAB — CBC
HEMATOCRIT: 26.4 % — AB (ref 36.0–46.0)
Hemoglobin: 8.3 g/dL — ABNORMAL LOW (ref 12.0–15.0)
MCH: 30.4 pg (ref 26.0–34.0)
MCHC: 31.4 g/dL (ref 30.0–36.0)
MCV: 96.7 fL (ref 78.0–100.0)
PLATELETS: 280 10*3/uL (ref 150–400)
RBC: 2.73 MIL/uL — AB (ref 3.87–5.11)
RDW: 24.4 % — AB (ref 11.5–15.5)
WBC: 14.3 10*3/uL — AB (ref 4.0–10.5)

## 2018-03-16 LAB — CBC
HCT: 25.7 % — ABNORMAL LOW (ref 36.0–46.0)
Hemoglobin: 8.1 g/dL — ABNORMAL LOW (ref 12.0–15.0)
MCH: 30.3 pg (ref 26.0–34.0)
MCHC: 31.5 g/dL (ref 30.0–36.0)
MCV: 96.3 fL (ref 78.0–100.0)
PLATELETS: 266 10*3/uL (ref 150–400)
RBC: 2.67 MIL/uL — ABNORMAL LOW (ref 3.87–5.11)
RDW: 24 % — AB (ref 11.5–15.5)
WBC: 14.9 10*3/uL — AB (ref 4.0–10.5)

## 2018-03-16 LAB — HEMOGLOBIN A1C
Hgb A1c MFr Bld: 4.7 % — ABNORMAL LOW (ref 4.8–5.6)
Mean Plasma Glucose: 88.19 mg/dL

## 2018-03-16 NOTE — Progress Notes (Signed)
[facility]  ROUNDING NOTE   Subjective:  Patient completed hemodialysis today. Tolerated well. Likely to be transitioned to rehabilitation facility in the relative near future.    Objective:  Vital signs in last 24 hours:  Temperature 97.5 pulse 94 respirations 22 blood pressure 117/69 end-stage renal disease.  Physical Exam: General: Chronically ill appearing  Head: Normocephalic, atraumatic. Moist oral mucosal membranes  Eyes: Anicteric  Neck: Tracheostomy in place, capped  Lungs:  Bilateral rhonchi, normal effort  Heart: S1S2 no rubs  Abdomen:  Soft, nontender, PEG  Extremities: trace peripheral edema.  Neurologic: Awake, alert, follows simple commands  Skin: No lesions  Access: Right internal jugular PermCath, LUE AVF    Basic Metabolic Panel: Recent Labs  Lab 03/11/18 0632 03/14/18 0558  NA 128* 126*  K 4.8 6.0*  CL 92* 91*  CO2 24 19*  GLUCOSE 106* 100*  BUN 46* 53*  CREATININE 2.51* 3.10*  CALCIUM 9.4 9.5  PHOS 6.1* 7.3*    Liver Function Tests: Recent Labs  Lab 03/11/18 0632 03/14/18 0558  ALBUMIN 2.4* 2.5*   No results for input(s): LIPASE, AMYLASE in the last 168 hours. No results for input(s): AMMONIA in the last 168 hours.  CBC: Recent Labs  Lab 03/10/18 0657 03/11/18 0632 03/14/18 0735 03/15/18 2023 03/16/18 0725  WBC 13.2* 15.2* 14.3* 14.3* 14.9*  HGB 7.9* 7.5* 6.7* 8.3* 8.1*  HCT 24.4* 23.4* 21.4* 26.4* 25.7*  MCV 96.4 96.7 99.5 96.7 96.3  PLT 252 288 315 280 266    Cardiac Enzymes: No results for input(s): CKTOTAL, CKMB, CKMBINDEX, TROPONINI in the last 168 hours.  BNP: Invalid input(s): POCBNP  CBG: No results for input(s): GLUCAP in the last 168 hours.  Microbiology: Results for orders placed or performed during the hospital encounter of 01/24/18  Culture, Urine     Status: None   Collection Time: 01/28/18  3:00 AM  Result Value Ref Range Status   Specimen Description URINE, RANDOM  Final   Special  Requests NONE  Final   Culture   Final    NO GROWTH Performed at [facility], [address], [place], [place] [address]    Report Status 01/29/2018 FINAL  Final    Coagulation Studies: No results for input(s): LABPROT, INR in the last 72 hours.  Urinalysis: No results for input(s): COLORURINE, LABSPEC, PHURINE, GLUCOSEU, HGBUR, BILIRUBINUR, KETONESUR, PROTEINUR, UROBILINOGEN, NITRITE, LEUKOCYTESUR in the last 72 hours.  Invalid input(s): APPERANCEUR    Imaging: No results found.   Medications:       Assessment/ Plan:  62 y.o. female with a PMHx of congestive heart failure, chronic kidney disease stage III, acute respiratory failure status post tracheostomy placement, hypertension, iron deficiency anemia, diabetes mellitus type 2, hyperlipidemia, obesity, who was admitted to [facility] on 01/24/2018 for ongoing treatment of pneumonia, acute respiratory failure, acute renal failure requiring hemodialysis.  1.    End-stage renal disease. Patient was seen by nephrology at the outside hospital.  She was initially started on CRRT at outside hospital and subsequently transitioned to intermittent hemodialysis. LUE AVF placed. -Patient has been dialysis dependent for greater than 6 weeks.  It appears that she is end-stage renal disease at the moment.  We will plan for dialysis treatment again on Friday as well as Saturday.  2.  Acute respiratory failure.    Tracheostomy remains capped and appears to be functioning quite well.  Patient breathing comfortably.  3.  Anemia of chronic kidney disease.  Hemoglobin  remains low at 8.1.  Continue to monitor.  4.  Secondary hyperparathyroidism.   Phosphorus remains high at 7.3.  She is on renvela powder.    5.  Hyperkalemia.  Last K was high at 6, recommend rechecking K prior to next HD.    LOS: 0 Tamsen Reist 6/19/20194:09 PM

## 2018-03-17 ENCOUNTER — Other Ambulatory Visit (HOSPITAL_COMMUNITY): Payer: Self-pay

## 2018-03-17 LAB — CBC
HCT: 27.9 % — ABNORMAL LOW (ref 36.0–46.0)
Hemoglobin: 8.7 g/dL — ABNORMAL LOW (ref 12.0–15.0)
MCH: 30.2 pg (ref 26.0–34.0)
MCHC: 31.2 g/dL (ref 30.0–36.0)
MCV: 96.9 fL (ref 78.0–100.0)
PLATELETS: 293 10*3/uL (ref 150–400)
RBC: 2.88 MIL/uL — ABNORMAL LOW (ref 3.87–5.11)
RDW: 24 % — AB (ref 11.5–15.5)
WBC: 14.9 10*3/uL — ABNORMAL HIGH (ref 4.0–10.5)

## 2018-03-17 LAB — AMYLASE: Amylase: 22 U/L — ABNORMAL LOW (ref 28–100)

## 2018-03-17 LAB — HEPATIC FUNCTION PANEL
ALT: 77 U/L — AB (ref 14–54)
ALT: 75 U/L — ABNORMAL HIGH (ref 14–54)
AST: 81 U/L — ABNORMAL HIGH (ref 15–41)
AST: 80 U/L — AB (ref 15–41)
Albumin: 2.4 g/dL — ABNORMAL LOW (ref 3.5–5.0)
Albumin: 2.3 g/dL — ABNORMAL LOW (ref 3.5–5.0)
Alkaline Phosphatase: 649 U/L — ABNORMAL HIGH (ref 38–126)
Alkaline Phosphatase: 666 U/L — ABNORMAL HIGH (ref 38–126)
BILIRUBIN DIRECT: 8.3 mg/dL — AB (ref 0.1–0.5)
BILIRUBIN DIRECT: 8.2 mg/dL — AB (ref 0.1–0.5)
BILIRUBIN INDIRECT: 4 mg/dL — AB (ref 0.3–0.9)
Indirect Bilirubin: 4.2 mg/dL — ABNORMAL HIGH (ref 0.3–0.9)
Total Bilirubin: 12.3 mg/dL — ABNORMAL HIGH (ref 0.3–1.2)
Total Bilirubin: 12.4 mg/dL — ABNORMAL HIGH (ref 0.3–1.2)
Total Protein: 6.3 g/dL — ABNORMAL LOW (ref 6.5–8.1)
Total Protein: 6.8 g/dL (ref 6.5–8.1)

## 2018-03-17 LAB — LIPASE, BLOOD: LIPASE: 18 U/L (ref 11–51)

## 2018-03-18 ENCOUNTER — Other Ambulatory Visit (HOSPITAL_COMMUNITY): Payer: Self-pay

## 2018-03-18 LAB — CBC
HCT: 25.3 % — ABNORMAL LOW (ref 36.0–46.0)
HEMOGLOBIN: 7.9 g/dL — AB (ref 12.0–15.0)
MCH: 30 pg (ref 26.0–34.0)
MCHC: 31.2 g/dL (ref 30.0–36.0)
MCV: 96.2 fL (ref 78.0–100.0)
PLATELETS: 285 10*3/uL (ref 150–400)
RBC: 2.63 MIL/uL — AB (ref 3.87–5.11)
RDW: 23.5 % — ABNORMAL HIGH (ref 11.5–15.5)
WBC: 15.9 10*3/uL — AB (ref 4.0–10.5)

## 2018-03-18 LAB — RENAL FUNCTION PANEL
Albumin: 2.3 g/dL — ABNORMAL LOW (ref 3.5–5.0)
Anion gap: 10 (ref 5–15)
BUN: 40 mg/dL — ABNORMAL HIGH (ref 6–20)
CALCIUM: 9 mg/dL (ref 8.9–10.3)
CO2: 27 mmol/L (ref 22–32)
CREATININE: 2.75 mg/dL — AB (ref 0.44–1.00)
Chloride: 93 mmol/L — ABNORMAL LOW (ref 101–111)
GFR, EST AFRICAN AMERICAN: 20 mL/min — AB (ref >60)
GFR, EST NON AFRICAN AMERICAN: 17 mL/min — AB (ref >60)
Glucose, Bld: 100 mg/dL — ABNORMAL HIGH (ref 65–99)
PHOSPHORUS: 5.4 mg/dL — AB (ref 2.5–4.6)
Potassium: 3.9 mmol/L (ref 3.5–5.1)
SODIUM: 130 mmol/L — AB (ref 135–145)

## 2018-03-18 NOTE — Progress Notes (Signed)
[name]  ROUNDING NOTE   Subjective:  Patient completed hemodialysis today. Ultrafiltration achieved was 1.6 kg. Patient also due for dialysis tomorrow as well.  Temperature 98 pulse 91 respirations 20 blood pressure 109/58   Objective:  Vital signs in last 24 hours:    Physical Exam: General: Chronically ill appearing  Head: Normocephalic, atraumatic. Moist oral mucosal membranes  Eyes: Anicteric  Neck: Tracheostomy in place, capped  Lungs:  Bilateral rhonchi, normal effort  Heart: S1S2 no rubs  Abdomen:  Soft, nontender, PEG  Extremities: trace peripheral edema.  Neurologic: Awake, alert, follows simple commands  Skin: No lesions  Access: Right internal jugular PermCath, LUE AVF    Basic Metabolic Panel: Recent Labs  Lab 03/14/18 0558 03/18/18 0500  NA 126* 130*  K 6.0* 3.9  CL 91* 93*  CO2 19* 27  GLUCOSE 100* 100*  BUN 53* 40*  CREATININE 3.10* 2.75*  CALCIUM 9.5 9.0  PHOS 7.3* 5.4*    Liver Function Tests: Recent Labs  Lab 03/14/18 0558 03/17/18 1606 03/17/18 1903 03/18/18 0500  AST  --  81* 80*  --   ALT  --  77* 75*  --   ALKPHOS  --  649* 666*  --   BILITOT  --  12.3* 12.4*  --   PROT  --  6.3* 6.8  --   ALBUMIN 2.5* 2.4* 2.3* 2.3*   Recent Labs  Lab 03/17/18 1606  LIPASE 18  AMYLASE 22*   No results for input(s): AMMONIA in the last 168 hours.  CBC: Recent Labs  Lab 03/14/18 0735 03/15/18 2023 03/16/18 0725 03/17/18 0638 03/18/18 0500  WBC 14.3* 14.3* 14.9* 14.9* 15.9*  HGB 6.7* 8.3* 8.1* 8.7* 7.9*  HCT 21.4* 26.4* 25.7* 27.9* 25.3*  MCV 99.5 96.7 96.3 96.9 96.2  PLT 315 280 266 293 285    Cardiac Enzymes: No results for input(s): CKTOTAL, CKMB, CKMBINDEX, TROPONINI in the last 168 hours.  BNP: Invalid input(s): POCBNP  CBG: No results for input(s): GLUCAP in the last 168 hours.  Microbiology: Results for orders placed or performed during the hospital encounter of 01/24/18  Culture, Urine     Status:  None   Collection Time: 01/28/18  3:00 AM  Result Value Ref Range Status   Specimen Description URINE, RANDOM  Final   Special Requests NONE  Final   Culture   Final    NO GROWTH Performed at [facility], [address] [place], [place] [address]    Report Status 01/29/2018 FINAL  Final    Coagulation Studies: No results for input(s): LABPROT, INR in the last 72 hours.  Urinalysis: No results for input(s): COLORURINE, LABSPEC, PHURINE, GLUCOSEU, HGBUR, BILIRUBINUR, KETONESUR, PROTEINUR, UROBILINOGEN, NITRITE, LEUKOCYTESUR in the last 72 hours.  Invalid input(s): APPERANCEUR    Imaging: Ct Abdomen Pelvis Wo Contrast  Result Date: 03/18/2018 CLINICAL DATA:  62 y/o  F; abdominal pain. EXAM: CT ABDOMEN AND PELVIS WITHOUT CONTRAST TECHNIQUE: Multidetector CT imaging of the abdomen and pelvis was performed following the standard protocol without IV contrast. COMPARISON:  03/03/2018 right upper quadrant ultrasound. FINDINGS: Lower chest: Small bilateral pleural effusions. Right lower lobe consolidation. Cardiomegaly and coronary artery calcific atherosclerosis. Hepatobiliary: No focal liver abnormality is seen. Cholelithiasis. No secondary signs of acute cholecystitis. Pancreas: Unremarkable. No pancreatic ductal dilatation or surrounding inflammatory changes. Spleen: Normal in size without focal abnormality. Adrenals/Urinary Tract: Adrenal glands are unremarkable. 17 mm right kidney interpolar cyst. Left-greater-than-right kidney atrophy. No urinary stone disease  or hydronephrosis. Bladder is unremarkable. Stomach/Bowel: Stomach is within normal limits. Gastrostomy tube balloon within the gastric body. Appendectomy. No evidence of bowel wall thickening, distention, or inflammatory changes. Vascular/Lymphatic: Aortic atherosclerosis. No enlarged abdominal or pelvic lymph nodes. Reproductive: Status post hysterectomy. No adnexal masses. Other: No abdominal wall hernia or abnormality. Small  volume of ascites, predominantly perihepatic. Musculoskeletal: No fracture is seen. IMPRESSION: 1. Right lower lobe consolidation compatible with pneumonia. 2. Small bilateral pleural effusions. 3. Cardiomegaly and coronary artery calcific atherosclerosis. 4. Left greater than right kidney atrophy. 5. Small volume of ascites, predominantly perihepatic. 6. Aortic atherosclerosis. 7. Cholelithiasis. Electronically Signed   By: Mitzi Hansen M.D.   On: 03/18/2018 02:14   Dg Abd Portable 1v  Result Date: 03/17/2018 CLINICAL DATA:  Generalized abdominal pain. EXAM: PORTABLE ABDOMEN - 1 VIEW COMPARISON:  Radiograph of January 24, 2018. FINDINGS: No abnormal large or small bowel dilatation is noted. Large amount of stool is noted in the rectum concerning for impaction. No abnormal calcifications are noted. IMPRESSION: Large amount of stool seen in the rectum concerning for impaction. No other abnormality seen. Electronically Signed   By: Lupita Raider, M.D.   On: 03/17/2018 16:27     Medications:       Assessment/ Plan:  62 y.o. female with a PMHx of congestive heart failure, chronic kidney disease stage III, acute respiratory failure status post tracheostomy placement, hypertension, iron deficiency anemia, diabetes mellitus type 2, hyperlipidemia, obesity, who was admitted to [facility] on 01/24/2018 for ongoing treatment of pneumonia, acute respiratory failure, acute renal failure requiring hemodialysis.  1.    End-stage renal disease. Patient was seen by nephrology at the outside hospital.  She was initially started on CRRT at outside hospital and subsequently transitioned to intermittent hemodialysis. LUE AVF placed. -Patient completed hemodialysis today.  Next dialysis treatment scheduled for tomorrow.  Thereafter she will have her next dialysis treatment as an outpatient in [facility].  2.  Acute respiratory failure.    Tracheostomy remains in place at the  moment.  3.  Anemia of chronic kidney disease.  Hemoglobin slightly down to 7.9.  We will start the patient on Epogen as an outpatient.  4.  Secondary hyperparathyroidism.   Phosphorus down to 5.4.  Maintain the patient on Renvela powder.  5.  Hyperkalemia.  Potassium corrected down to 3.9.   LOS: 0 Terri Ellis 6/21/20194:45 PM

## 2018-03-19 LAB — RENAL FUNCTION PANEL
Albumin: 2.4 g/dL — ABNORMAL LOW (ref 3.5–5.0)
Anion gap: 10 (ref 5–15)
BUN: 29 mg/dL — ABNORMAL HIGH (ref 6–20)
CHLORIDE: 96 mmol/L — AB (ref 101–111)
CO2: 27 mmol/L (ref 22–32)
CREATININE: 2.12 mg/dL — AB (ref 0.44–1.00)
Calcium: 9.3 mg/dL (ref 8.9–10.3)
GFR, EST AFRICAN AMERICAN: 28 mL/min — AB (ref >60)
GFR, EST NON AFRICAN AMERICAN: 24 mL/min — AB (ref >60)
Glucose, Bld: 102 mg/dL — ABNORMAL HIGH (ref 65–99)
POTASSIUM: 3.7 mmol/L (ref 3.5–5.1)
Phosphorus: 4 mg/dL (ref 2.5–4.6)
Sodium: 133 mmol/L — ABNORMAL LOW (ref 135–145)

## 2018-03-19 LAB — CBC
HEMATOCRIT: 25.6 % — AB (ref 36.0–46.0)
HEMOGLOBIN: 7.9 g/dL — AB (ref 12.0–15.0)
MCH: 30.2 pg (ref 26.0–34.0)
MCHC: 30.9 g/dL (ref 30.0–36.0)
MCV: 97.7 fL (ref 78.0–100.0)
PLATELETS: 312 10*3/uL (ref 150–400)
RBC: 2.62 MIL/uL — AB (ref 3.87–5.11)
RDW: 23.5 % — ABNORMAL HIGH (ref 11.5–15.5)
WBC: 16.4 10*3/uL — AB (ref 4.0–10.5)

## 2018-03-28 DEATH — deceased

## 2018-04-06 ENCOUNTER — Encounter (HOSPITAL_COMMUNITY): Payer: Self-pay

## 2019-11-21 IMAGING — CR DG CHEST 1V PORT
1 series · 1 of 1 positions shown · non-contrast
Comparison: None.

CLINICAL DATA: Shortness of breath

EXAM:
PORTABLE CHEST 1 VIEW

[AP]
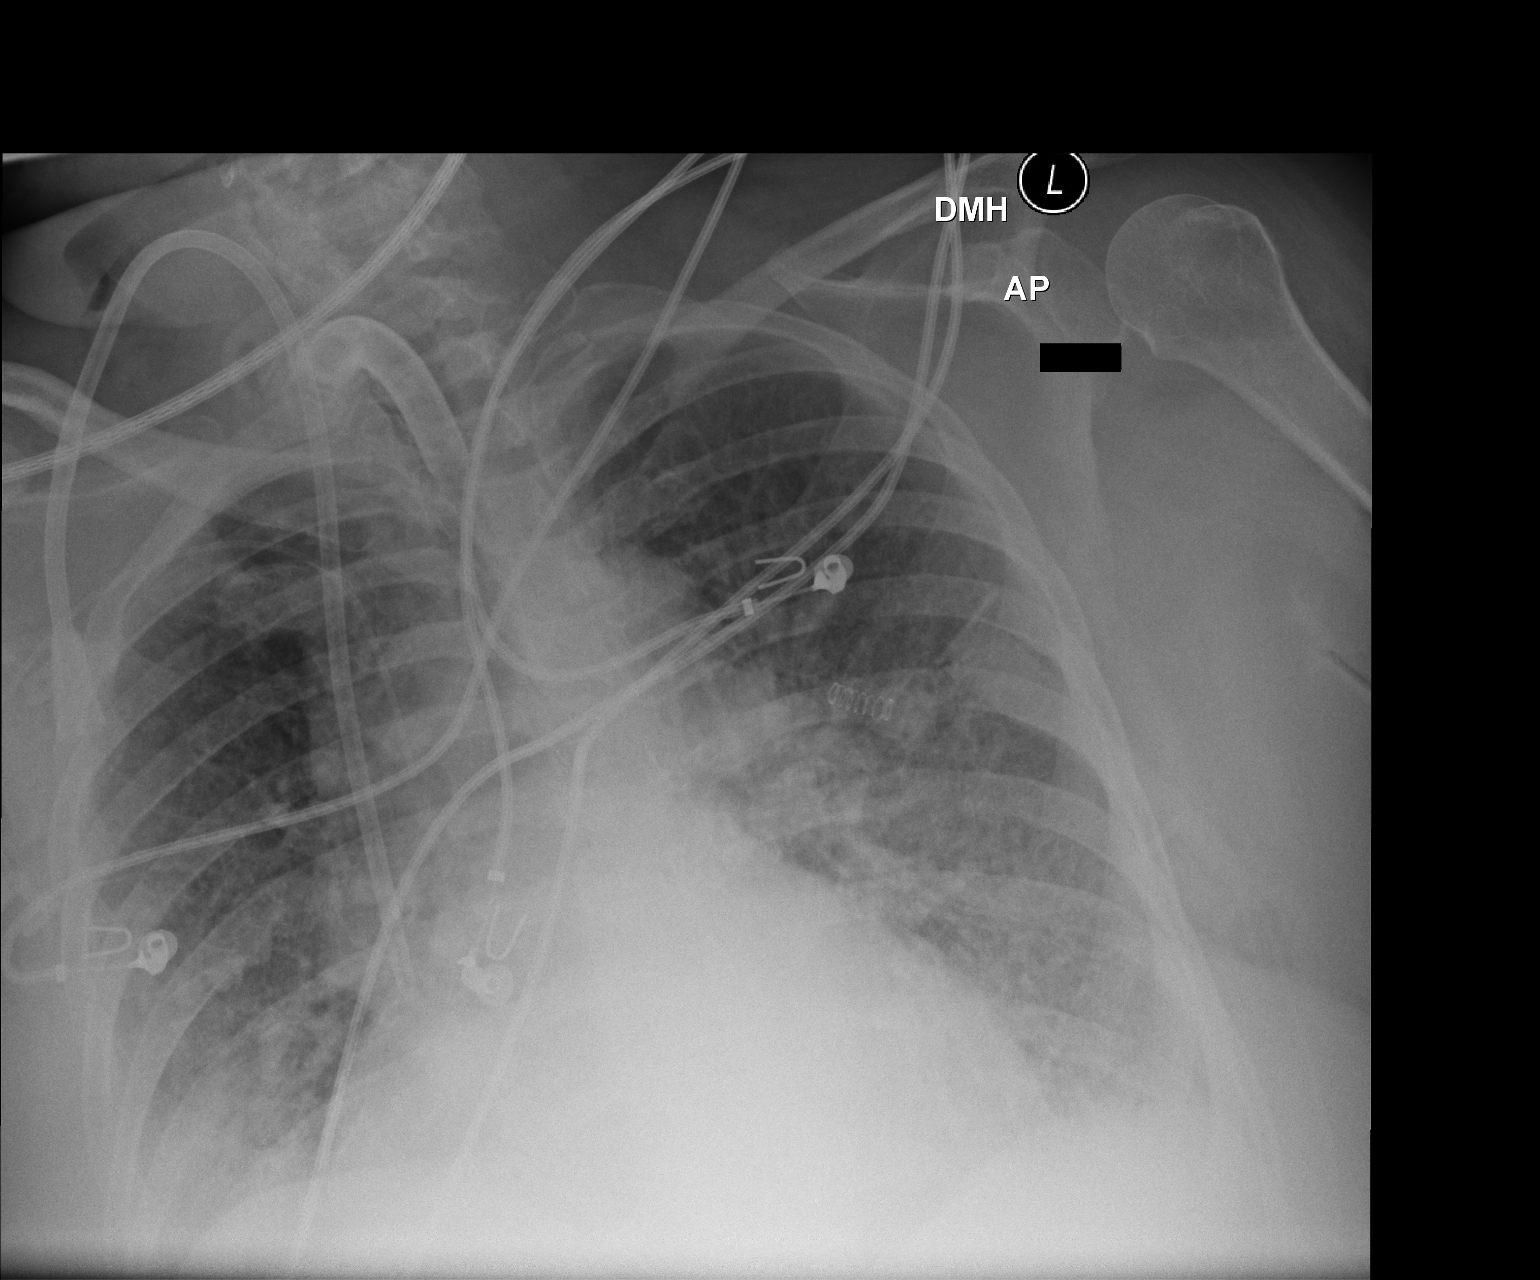

[1 of 1 positions shown; findings below may reference images not displayed]

FINDINGS: There is cardiomegaly present with mild pulmonary edema. Small
effusions cannot be excluded. Large bore right central venous line
tip overlies the lower SVC and tracheostomy is present.
IMPRESSION: 1. Cardiomegaly with mild edema. Cannot exclude small pleural
effusions.
2. Tracheostomy and right central venous line are present.

## 2019-11-24 IMAGING — DX DG CHEST 1V PORT
1 series · 1 of 1 positions shown · non-contrast
Comparison: 01/25/2018.

CLINICAL DATA: Chest pain.

EXAM:
PORTABLE CHEST 1 VIEW

[chest]
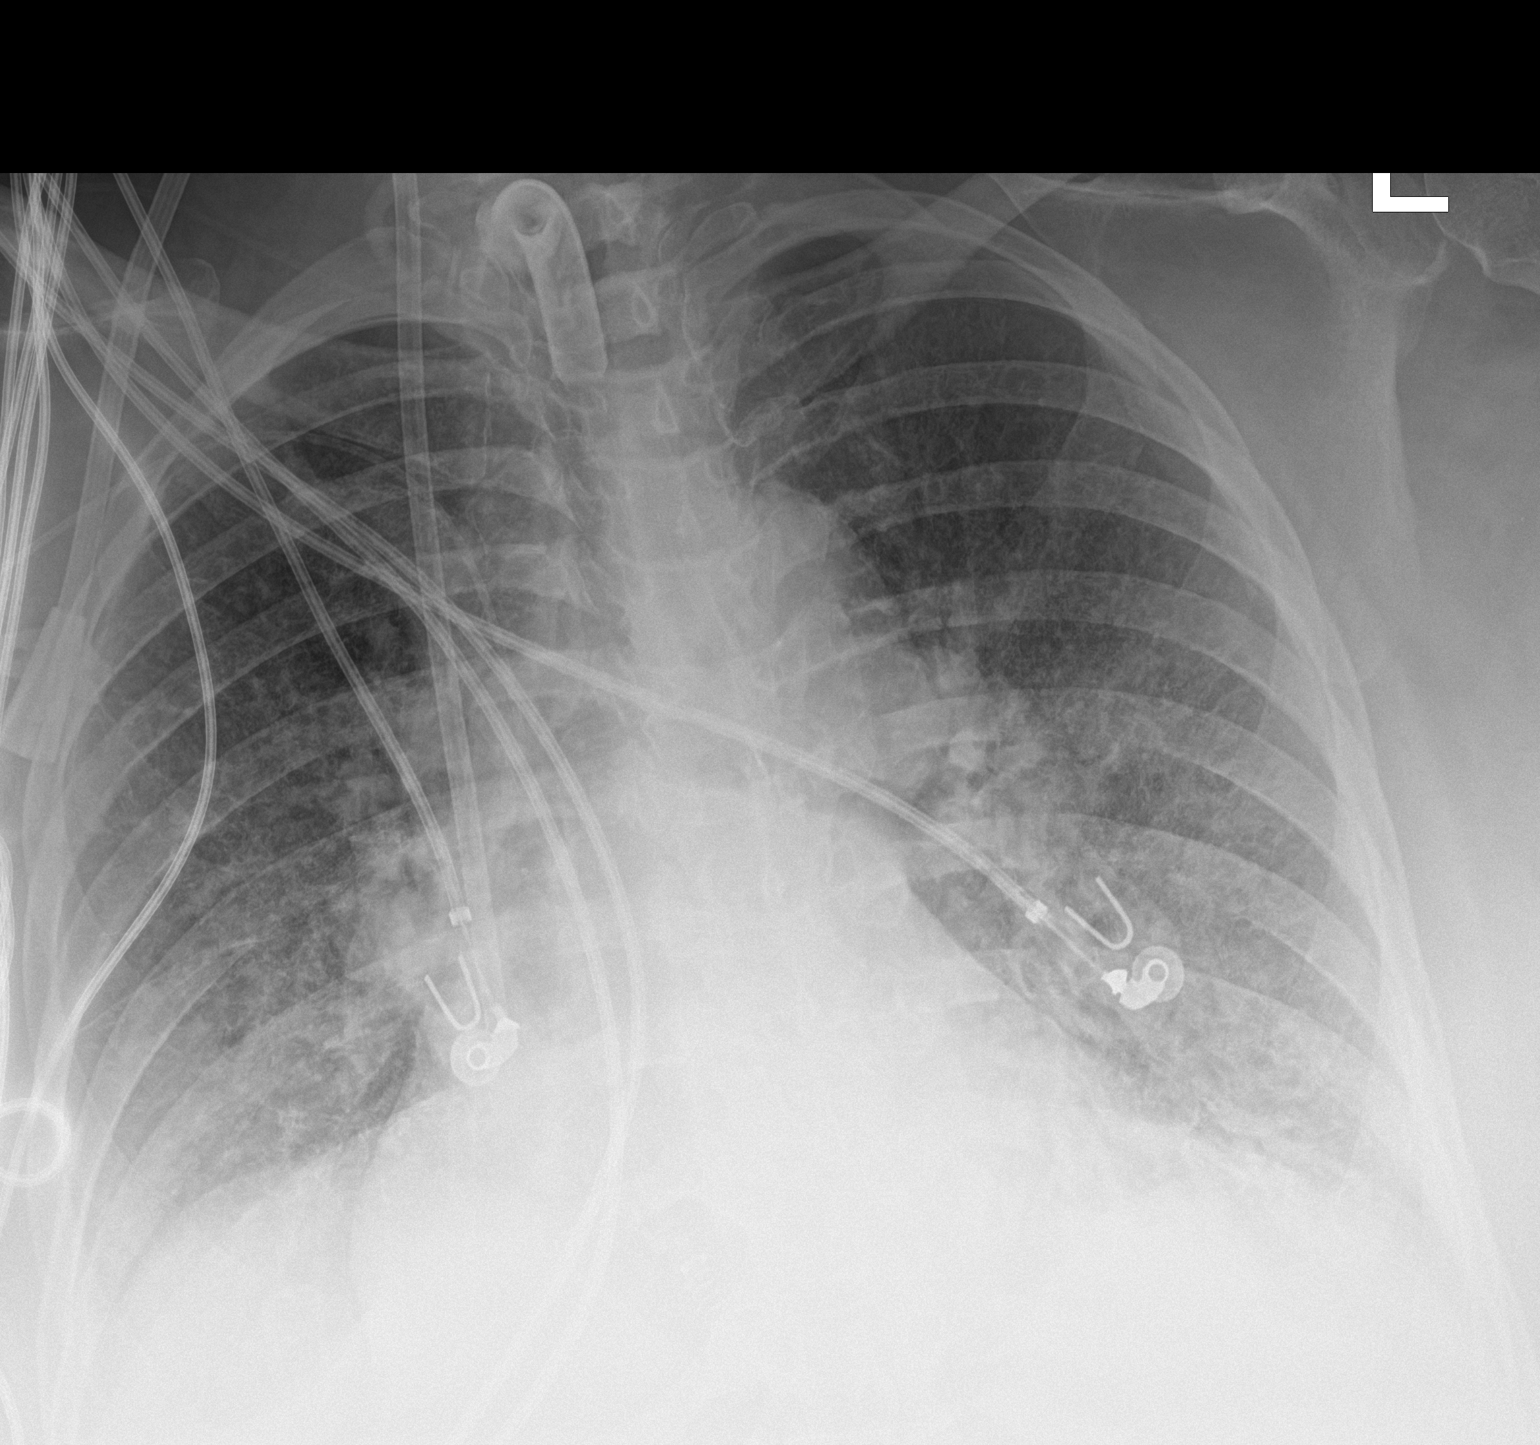

[1 of 1 positions shown; findings below may reference images not displayed]

FINDINGS: Tracheostomy tube noted stable position. Dialysis catheter and right
PICC line in stable position. Stable cardiomegaly. Unchanged diffuse
bilateral pulmonary infiltrates most consistent pulmonary edema.
Small pleural effusions again most likely present. No pneumothorax.
IMPRESSION: 1.  Lines and tubes in stable position.

2. Persistent changes of congestive heart failure bilateral
pulmonary edema small pleural effusions. No significant interim
change from prior exam.

## 2019-11-29 IMAGING — DX DG CHEST 1V PORT
1 series · 1 of 1 positions shown · non-contrast
Comparison: 01/28/2018

CLINICAL DATA: 61-year-old with elevated white blood cell count.

EXAM:
PORTABLE CHEST 1 VIEW

[chest ap]
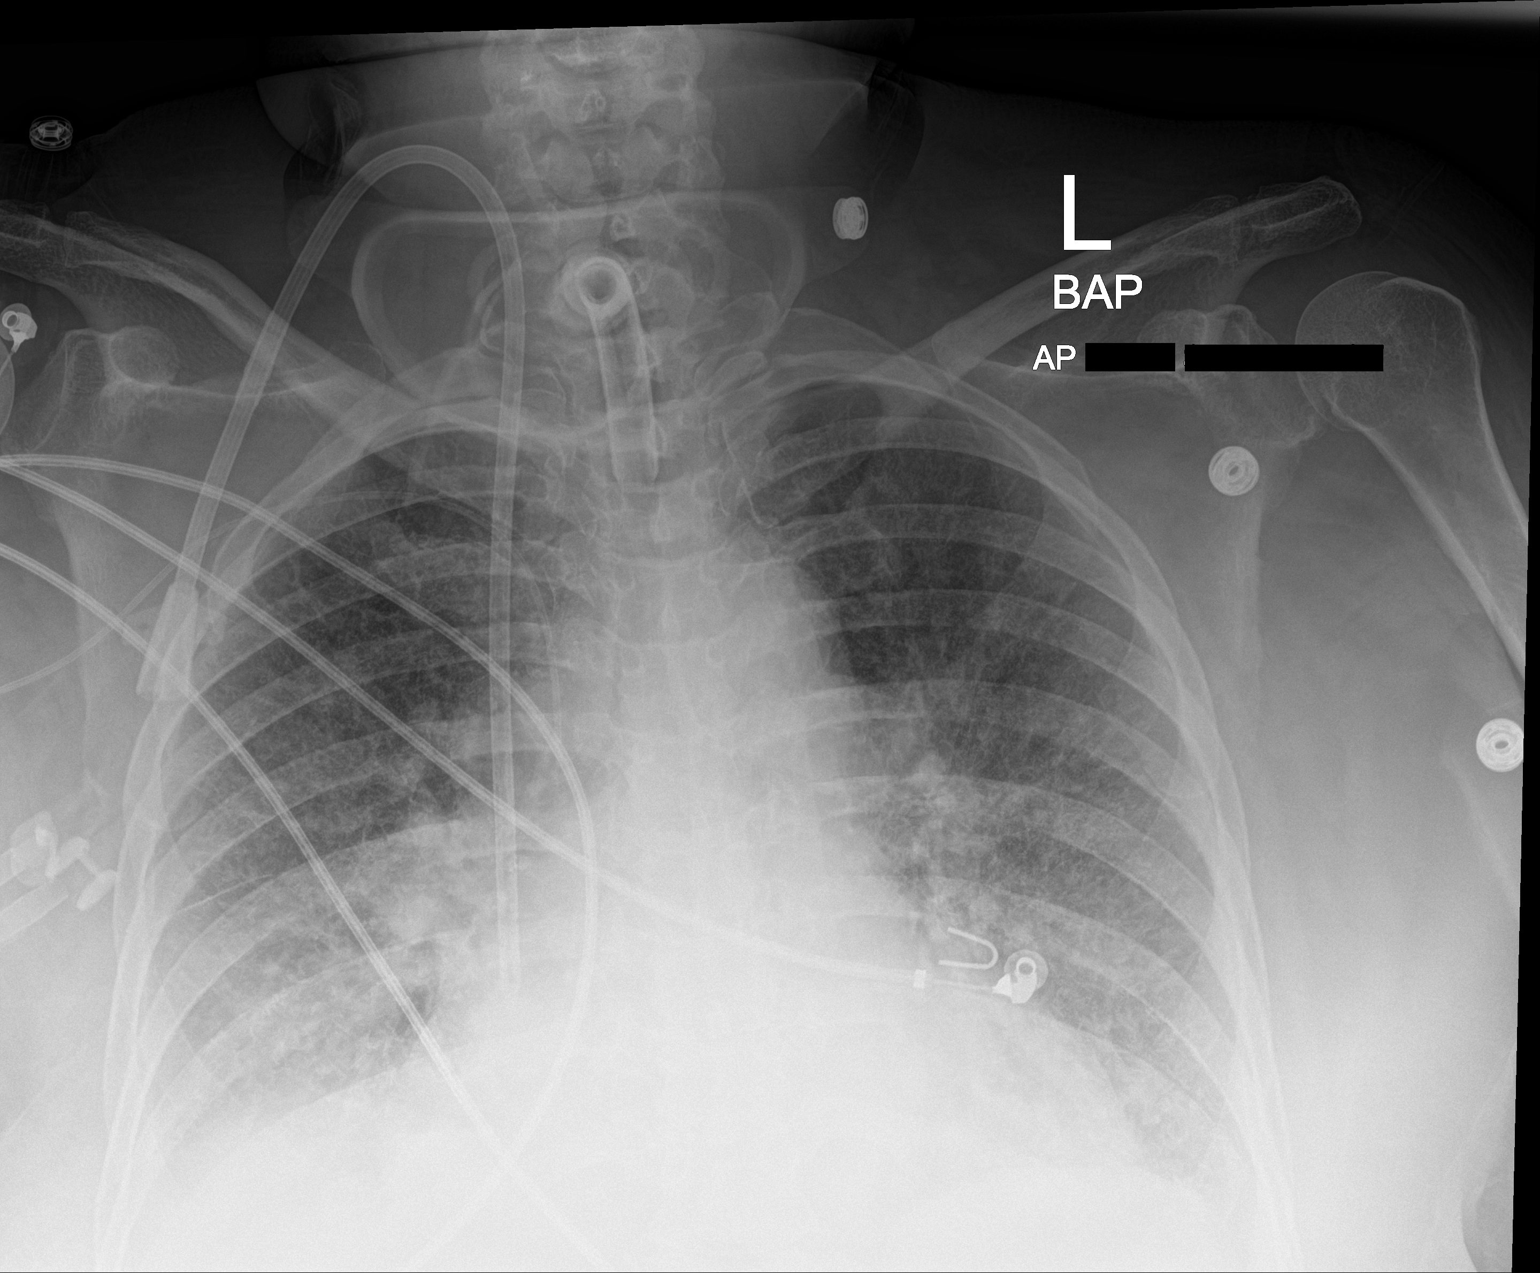

[1 of 1 positions shown; findings below may reference images not displayed]

FINDINGS: Again noted is a right jugular dialysis catheter with the tip near
the superior cavoatrial junction. There is a right arm PICC line
with the tip in the upper SVC region. Hazy interstitial densities in
both lungs are suggestive for pulmonary edema and similar to the
previous examination. Lung bases are incompletely imaged. Heart size
appears to be upper limits of normal. Patient has a tracheostomy
tube.
IMPRESSION: Bilateral interstitial lung densities. Findings are suggestive for
pulmonary edema and incomplete evaluation of the lung bases. Pleural
effusions cannot be excluded.

Central lines as described.

## 2020-01-30 IMAGING — US US ABDOMEN LIMITED
1 series · 14 of 25 positions shown · non-contrast
Comparison: None.

CLINICAL DATA: Elevated liver function tests. History of PEG tube,
respiratory failure, end-stage renal disease on dialysis.

EXAM:
ULTRASOUND ABDOMEN LIMITED RIGHT UPPER QUADRANT

[Series 1: us abdomen limited · 0.33mm/px · 14 of 44 slices shown]
[im 1/44]
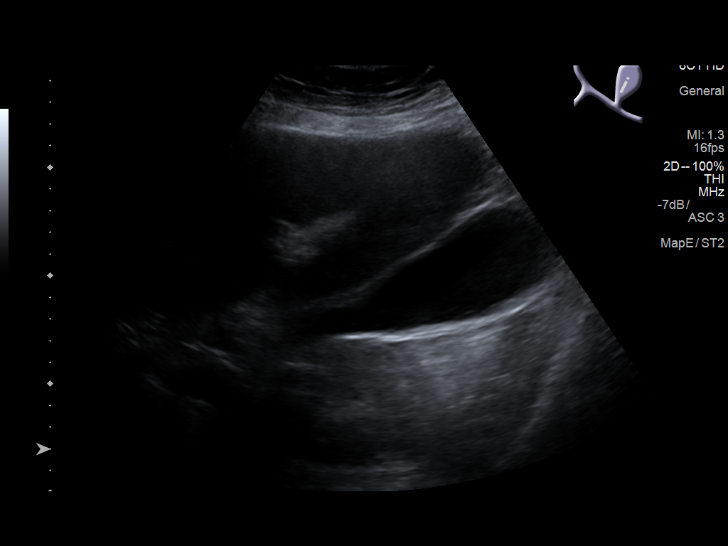
[im 4/44]
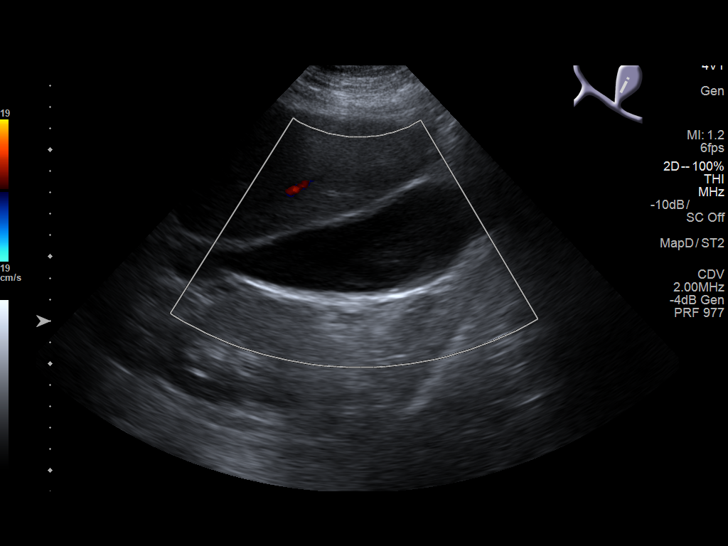
[im 8/44]
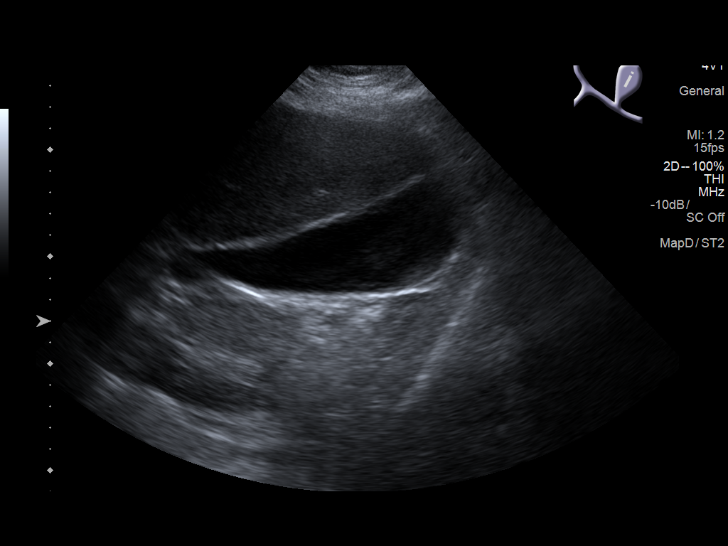
[im 11/44]
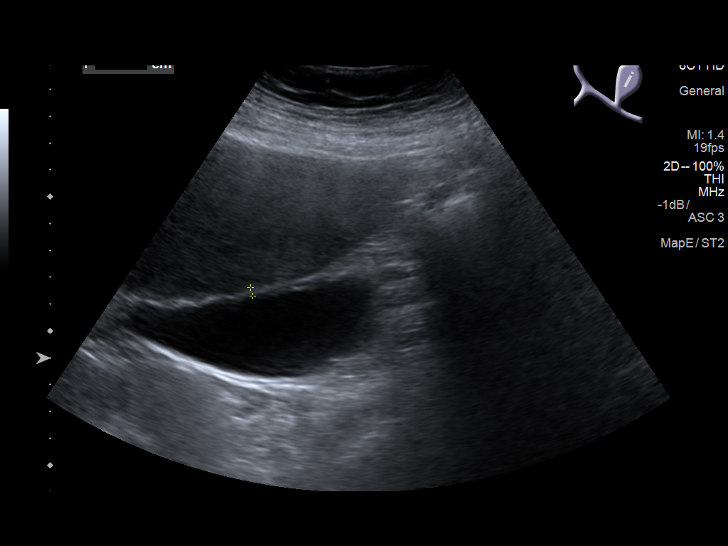
[im 15/44]
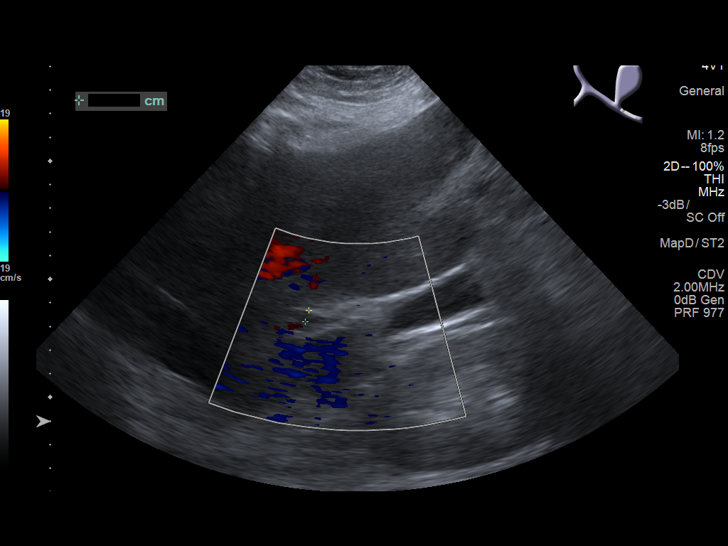
[im 17/44]
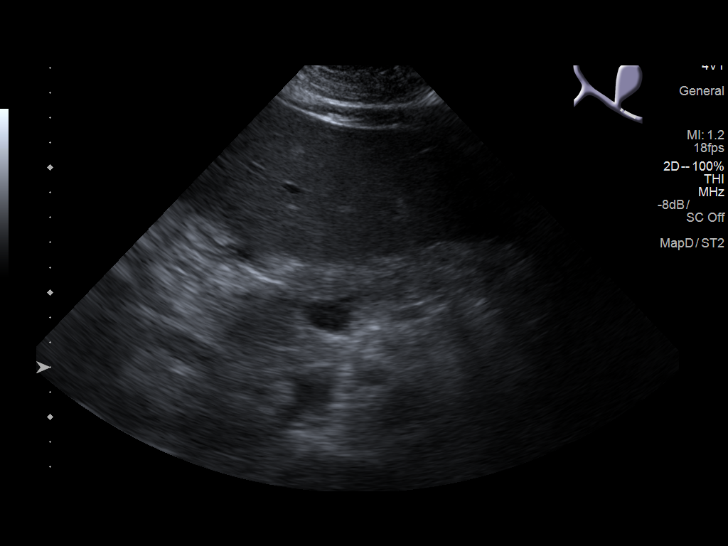
[im 20/44]
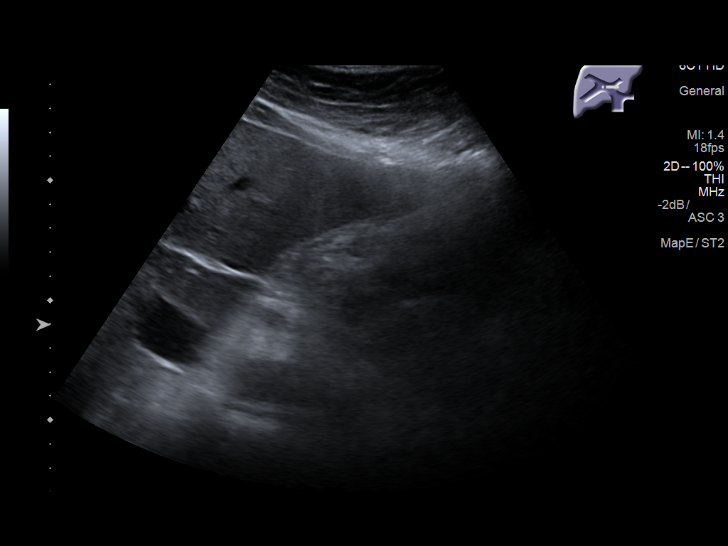
[im 24/44]
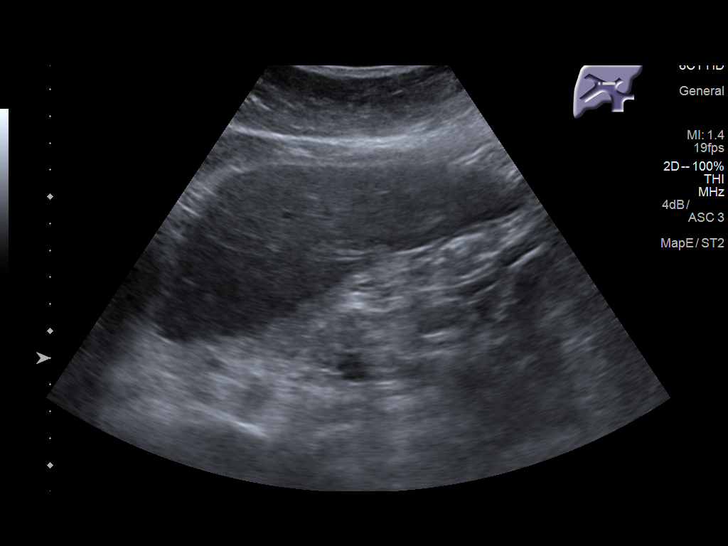
[im 27/44]
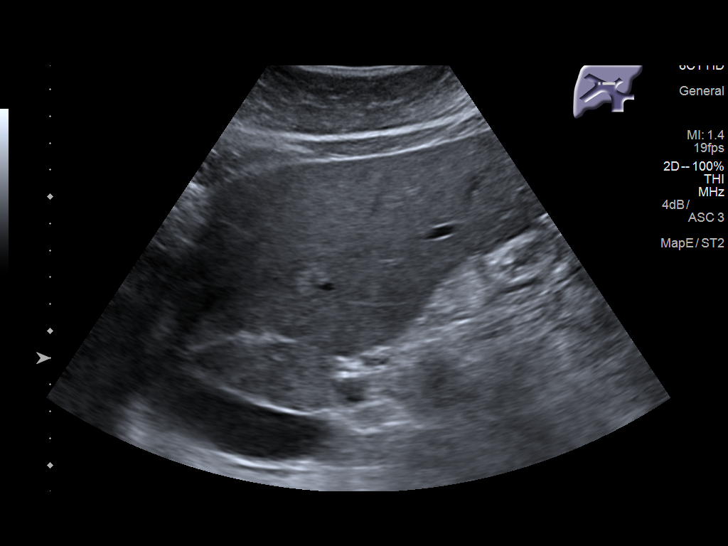
[im 29/44]
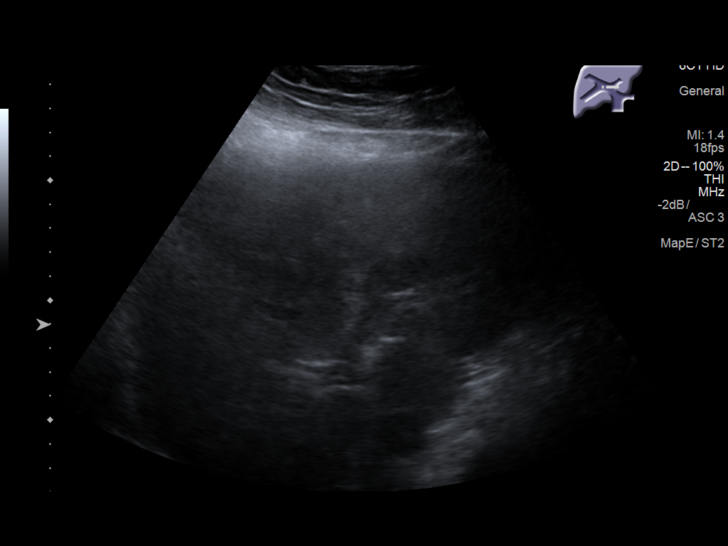
[im 33/44]
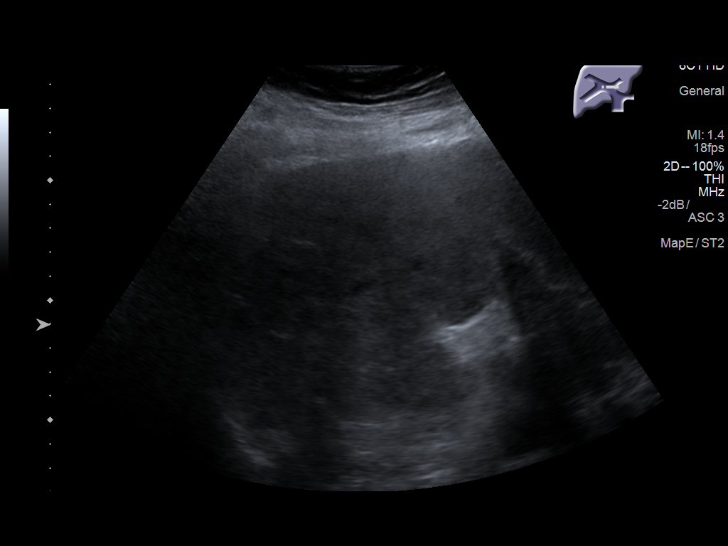
[im 36/44]
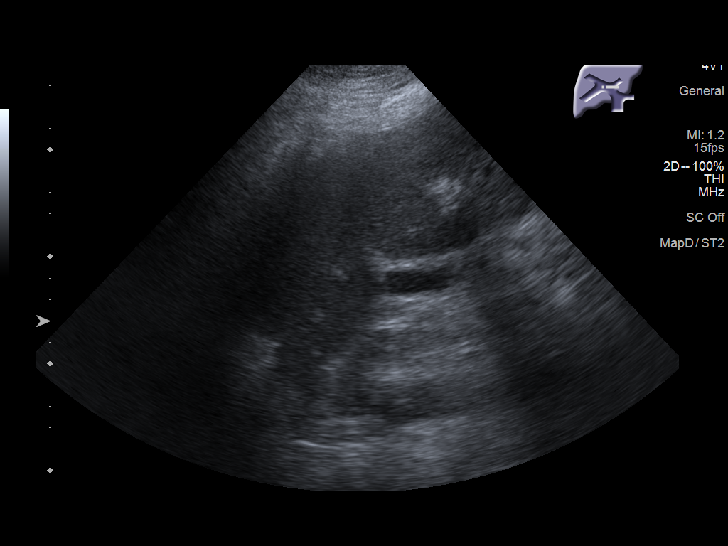
[im 40/44]
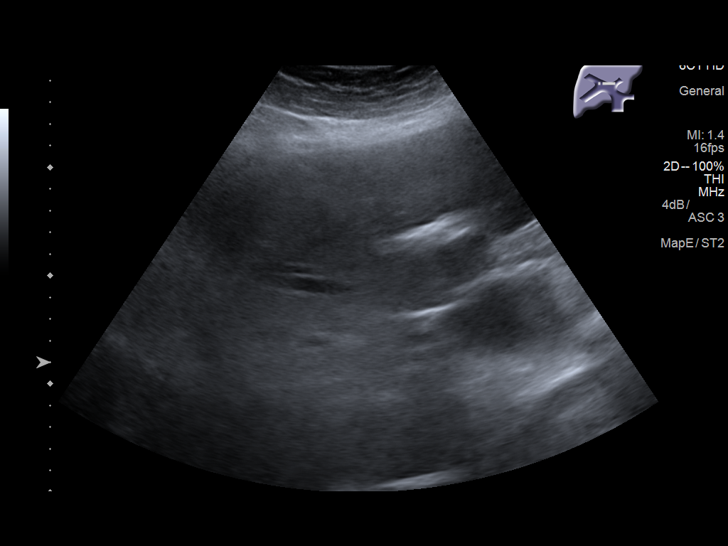
[im 44/44]
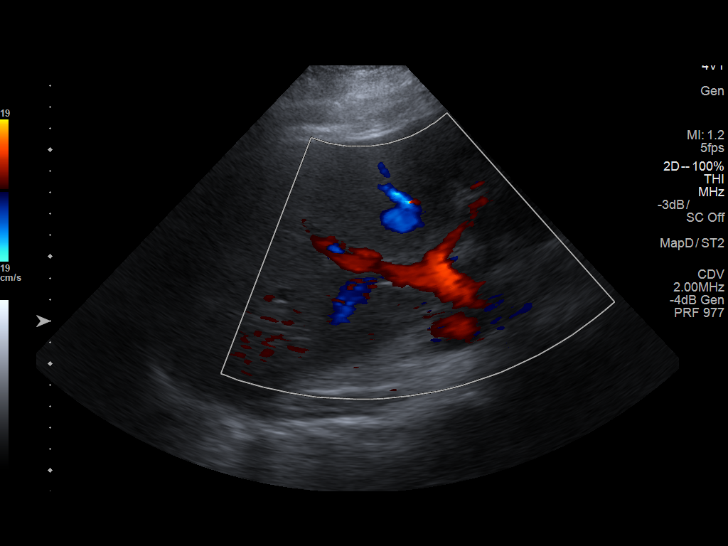

[14 of 25 positions shown; findings below may reference images not displayed]

FINDINGS: Gallbladder:

Dependent echogenic sludge. Mild gallbladder wall thickening at 3
mm. No pericholecystic fluid. No sonographic Murphy's sign elicited.

Common bile duct:

Diameter: 5 mm.

Liver:

No focal lesion identified. Within normal limits in parenchymal
echogenicity. Portal vein is patent on color Doppler imaging with
normal direction of blood flow towards the liver.

Small RIGHT pleural effusion.
IMPRESSION: 1. Gallbladder sludge and mild gallbladder wall thickening without
additional findings of acute cholecystitis.
2. Small RIGHT pleural effusion.
